# Patient Record
Sex: Male | Born: 1950
Health system: Southern US, Community
[De-identification: ages and names within clinical notes are randomized; demographics above are authoritative.]

## PROBLEM LIST (undated history)

## (undated) DIAGNOSIS — I10 Essential (primary) hypertension: Secondary | ICD-10-CM

## (undated) DIAGNOSIS — K5790 Diverticulosis of intestine, part unspecified, without perforation or abscess without bleeding: Secondary | ICD-10-CM

## (undated) DIAGNOSIS — K635 Polyp of colon: Secondary | ICD-10-CM

## (undated) DIAGNOSIS — Z8619 Personal history of other infectious and parasitic diseases: Secondary | ICD-10-CM

## (undated) DIAGNOSIS — I219 Acute myocardial infarction, unspecified: Secondary | ICD-10-CM

## (undated) DIAGNOSIS — K648 Other hemorrhoids: Secondary | ICD-10-CM

## (undated) DIAGNOSIS — B019 Varicella without complication: Secondary | ICD-10-CM

## (undated) DIAGNOSIS — E785 Hyperlipidemia, unspecified: Secondary | ICD-10-CM

## (undated) DIAGNOSIS — J439 Emphysema, unspecified: Secondary | ICD-10-CM

## (undated) DIAGNOSIS — Z87891 Personal history of nicotine dependence: Secondary | ICD-10-CM

## (undated) DIAGNOSIS — I2510 Atherosclerotic heart disease of native coronary artery without angina pectoris: Secondary | ICD-10-CM

## (undated) HISTORY — DX: Acute myocardial infarction, unspecified: I21.9

## (undated) HISTORY — DX: Varicella without complication: B01.9

## (undated) HISTORY — PX: VASECTOMY: SHX75

## (undated) HISTORY — DX: Other hemorrhoids: K64.8

## (undated) HISTORY — DX: Polyp of colon: K63.5

## (undated) HISTORY — DX: Hyperlipidemia, unspecified: E78.5

## (undated) HISTORY — DX: Emphysema, unspecified: J43.9

## (undated) HISTORY — PX: COLONOSCOPY: SHX174

## (undated) HISTORY — DX: Diverticulosis of intestine, part unspecified, without perforation or abscess without bleeding: K57.90

## (undated) HISTORY — DX: Essential (primary) hypertension: I10

## (undated) HISTORY — DX: Atherosclerotic heart disease of native coronary artery without angina pectoris: I25.10

## (undated) HISTORY — PX: WISDOM TOOTH EXTRACTION: SHX21

## (undated) HISTORY — DX: Personal history of nicotine dependence: Z87.891

## (undated) HISTORY — PX: POLYPECTOMY: SHX149

## (undated) HISTORY — PX: CORONARY STENT PLACEMENT: SHX1402

## (undated) HISTORY — DX: Personal history of other infectious and parasitic diseases: Z86.19

## (undated) HISTORY — PX: KNEE ARTHROSCOPY: SUR90

---

## 1978-09-03 HISTORY — PX: VASECTOMY: SHX75

## 1978-09-03 HISTORY — PX: WISDOM TOOTH EXTRACTION: SHX21

## 1995-09-04 HISTORY — PX: KNEE ARTHROSCOPY: SUR90

## 2003-09-04 HISTORY — PX: KNEE ARTHROSCOPY: SUR90

## 2007-09-04 HISTORY — PX: CORONARY STENT PLACEMENT: SHX1402

## 2007-12-08 ENCOUNTER — Ambulatory Visit: Payer: Self-pay | Admitting: Cardiovascular Disease

## 2007-12-08 ENCOUNTER — Inpatient Hospital Stay (HOSPITAL_COMMUNITY): Admission: EM | Admit: 2007-12-08 | Discharge: 2007-12-11 | Payer: Self-pay | Admitting: Emergency Medicine

## 2007-12-08 DIAGNOSIS — I219 Acute myocardial infarction, unspecified: Secondary | ICD-10-CM

## 2007-12-08 DIAGNOSIS — I2510 Atherosclerotic heart disease of native coronary artery without angina pectoris: Secondary | ICD-10-CM | POA: Insufficient documentation

## 2007-12-08 HISTORY — DX: Acute myocardial infarction, unspecified: I21.9

## 2007-12-09 ENCOUNTER — Encounter: Payer: Self-pay | Admitting: Cardiovascular Disease

## 2007-12-30 ENCOUNTER — Ambulatory Visit: Payer: Self-pay | Admitting: Cardiovascular Disease

## 2008-03-01 ENCOUNTER — Encounter: Payer: Self-pay | Admitting: Cardiovascular Disease

## 2008-03-01 ENCOUNTER — Ambulatory Visit: Payer: Self-pay

## 2008-03-01 ENCOUNTER — Ambulatory Visit: Payer: Self-pay | Admitting: Cardiovascular Disease

## 2008-03-01 LAB — CONVERTED CEMR LAB
Albumin: 3.7 g/dL (ref 3.5–5.2)
BUN: 15 mg/dL (ref 6–23)
Calcium: 9 mg/dL (ref 8.4–10.5)
Cholesterol: 125 mg/dL (ref 0–200)
Creatinine, Ser: 1.2 mg/dL (ref 0.4–1.5)
GFR calc Af Amer: 81 mL/min
GFR calc non Af Amer: 67 mL/min
LDL Cholesterol: 58 mg/dL (ref 0–99)
Total CHOL/HDL Ratio: 2.2
Triglycerides: 51 mg/dL (ref 0–149)
VLDL: 10 mg/dL (ref 0–40)

## 2008-08-16 ENCOUNTER — Ambulatory Visit: Payer: Self-pay | Admitting: Cardiovascular Disease

## 2008-08-16 LAB — CONVERTED CEMR LAB
ALT: 30 units/L (ref 0–53)
Albumin: 3.9 g/dL (ref 3.5–5.2)
Bilirubin, Direct: 0.1 mg/dL (ref 0.0–0.3)
Cholesterol: 128 mg/dL (ref 0–200)
HDL: 58.8 mg/dL (ref >39.0)
Total Protein: 6.7 g/dL (ref 6.0–8.3)
Triglycerides: 70 mg/dL (ref 0–149)
VLDL: 14 mg/dL (ref 0–40)

## 2008-09-07 DIAGNOSIS — E785 Hyperlipidemia, unspecified: Secondary | ICD-10-CM | POA: Insufficient documentation

## 2008-09-20 ENCOUNTER — Ambulatory Visit: Payer: Self-pay

## 2009-01-14 ENCOUNTER — Encounter: Payer: Self-pay | Admitting: Cardiovascular Disease

## 2009-01-24 ENCOUNTER — Encounter (INDEPENDENT_AMBULATORY_CARE_PROVIDER_SITE_OTHER): Payer: Self-pay | Admitting: *Deleted

## 2009-06-02 ENCOUNTER — Ambulatory Visit: Payer: Self-pay | Admitting: Cardiovascular Disease

## 2009-09-16 ENCOUNTER — Ambulatory Visit: Payer: Self-pay | Admitting: Cardiovascular Disease

## 2009-09-23 ENCOUNTER — Telehealth: Payer: Self-pay | Admitting: Cardiovascular Disease

## 2009-09-23 LAB — CONVERTED CEMR LAB
Bilirubin, Direct: 0.1 mg/dL (ref 0.0–0.3)
HDL: 63.1 mg/dL (ref >39.00)
Total CHOL/HDL Ratio: 3
Total Protein: 6.4 g/dL (ref 6.0–8.3)
Triglycerides: 65 mg/dL (ref 0.0–149.0)

## 2010-07-31 ENCOUNTER — Encounter: Payer: Self-pay | Admitting: Cardiovascular Disease

## 2010-07-31 ENCOUNTER — Ambulatory Visit: Payer: Self-pay | Admitting: Cardiovascular Disease

## 2010-10-03 NOTE — Progress Notes (Signed)
Summary: returning call  Phone Note Call from Patient Call back at [phone]   Caller: Patient Reason for Call: Talk to Nurse Summary of Call: returing call Initial call taken by: Migdalia Dk,  September 23, 2009 10:06 AM  Follow-up for Phone Call        I spoke with the pt.  Follow-up by: Sherri Rad, RN, BSN,  September 23, 2009 10:24 AM

## 2010-10-03 NOTE — Assessment & Plan Note (Signed)
Summary: per check out/sf   Visit Type:  1 year follow up Primary Lovada Barwick:  none  CC:  No complaints.  History of Present Illness: This is a 60 year old gentleman who sustained an anterior wall myocardial infarction in 2009. He was treated with primary PCI using a bare-metal stent to the LAD. He has had full recovery of LV function and had a normal Myoview stress scan in 2010.  The patient is doing well without complaints. He denies chest pain or pressure. No dyspnea, edema, orthopnea, palps, or PND.    Current Medications (verified): 1)  Simvastatin 40 Mg Tabs (Simvastatin) .... Take One Tablet By Mouth Daily At Bedtime 2)  Carvedilol 6.25 Mg Tabs (Carvedilol) .... Take One Tablet By Mouth Twice A Day 3)  Aspirin 81 Mg Tbec (Aspirin) .... Take One Tablet By Mouth Daily 4)  Nitrostat 0.4 Mg Subl (Nitroglycerin) .... As Directed For Chest Pain  Allergies (verified): No Known Drug Allergies  Past History:  Past medical history reviewed for relevance to current acute and chronic problems.  Past Medical History: Reviewed history from 09/07/2008 and no changes required. CAD s/p anterior MI 12/2007 Hyperlipidemia Hx tobacco  Review of Systems       Negative except as per HPI   Vital Signs:  Patient profile:   60 year old male Height:      72 inches Weight:      184.75 pounds BMI:     25.15 Pulse rate:   56 / minute Pulse rhythm:   regular Resp:     18 per minute BP sitting:   124 / 80  (left arm) Cuff size:   large  Vitals Entered By: Vikki Ports (July 31, 2010 9:53 AM)  Physical Exam  General:  Pt is alert and oriented, in no acute distress. HEENT: normal Neck: normal carotid upstrokes without bruits, JVP normal Lungs: CTA CV: regular, bradycardic without murmur or gallop Abd: soft, NT, positive BS, no bruit, no organomegaly Ext: no clubbing, cyanosis, or edema. peripheral pulses 2+ and equal Skin: warm and dry without rash    EKG  Procedure date:   07/31/2010  Findings:      Sinus bradycardia, otherwise within normal limits. HR 56 bpm.  Impression & Recommendations:  Problem # 1:  CAD, NATIVE VESSEL (ICD-414.01) Pt with hx of MI. Doing well without anginal symptoms and LV function has fully recovered. Recommend continue current medical management without changes. Encouraged him to initiate an exercise program as he has gained 5 pounds since last year. He continues to abstain from tobacco ever since his MI in 2009.  His updated medication list for this problem includes:    Carvedilol 6.25 Mg Tabs (Carvedilol) .Marland Kitchen... Take one tablet by mouth twice a day    Aspirin 81 Mg Tbec (Aspirin) .Marland Kitchen... Take one tablet by mouth daily    Nitrostat 0.4 Mg Subl (Nitroglycerin) .Marland Kitchen... As directed for chest pain  Orders: EKG w/ Interpretation (93000)  Problem # 2:  HYPERLIPIDEMIA-MIXED (ICD-272.4) Lipids have been at goal. Recommend followup lipids and lft's in January 2012 for one year lab followup.   He requests a PCP referral for appropriate screening eval. He has never had a PSA or colonoscopy checked. Will refer him to Dr Abner Greenspan in [facility].  His updated medication list for this problem includes:    Simvastatin 40 Mg Tabs (Simvastatin) .Marland Kitchen... Take one tablet by mouth daily at bedtime  Orders: EKG w/ Interpretation (93000)  CHOL: 166 (09/16/2009)   LDL:  90 (09/16/2009)   HDL: 63.10 (09/16/2009)   TG: 65.0 (09/16/2009)  Patient Instructions: 1)  Your physician recommends that you schedule a follow-up appointment in: 1 year with Dr. Excell Seltzer 2)  Your physician recommends that you return for a FASTING lipid profile & liver test in JANUARY 2012 3)  Your physician recommends that you continue on your current medications as directed. Please refer to the Current Medication list given to you today. 4)  You have been referred to DR. BLYTHE AT [facility] Telephone # [phone] Prescriptions: NITROSTAT 0.4 MG SUBL (NITROGLYCERIN) As directed for  chest pain  #25 x 11   Entered by:   Lisabeth Devoid RN   Authorized by:   Norva Karvonen, MD   Signed by:   Lisabeth Devoid RN on 07/31/2010   Method used:   Electronically to        [facility] (retail)       [address]       [place], [place]  [address]       Ph: [phone] or [phone]       Fax: [phone]   RxID:   [id number]

## 2010-10-17 ENCOUNTER — Encounter: Payer: Self-pay | Admitting: Cardiovascular Disease

## 2010-10-17 ENCOUNTER — Other Ambulatory Visit: Payer: Self-pay | Admitting: Cardiovascular Disease

## 2010-10-17 ENCOUNTER — Other Ambulatory Visit (INDEPENDENT_AMBULATORY_CARE_PROVIDER_SITE_OTHER): Payer: BC Managed Care – PPO

## 2010-10-17 DIAGNOSIS — E785 Hyperlipidemia, unspecified: Secondary | ICD-10-CM

## 2010-10-17 LAB — LIPID PANEL
LDL Cholesterol: 98 mg/dL (ref 0–99)
VLDL: 13.2 mg/dL (ref 0.0–40.0)

## 2010-10-17 LAB — HEPATIC FUNCTION PANEL
ALT: 38 U/L (ref 0–53)
Bilirubin, Direct: 0.1 mg/dL (ref 0.0–0.3)
Total Bilirubin: 0.6 mg/dL (ref 0.3–1.2)

## 2011-01-16 NOTE — Cardiovascular Report (Signed)
NAMEOLAN, KUREK NO.:  [id number]   MEDICAL RECORD NO.:  [id number]          PATIENT TYPE:  INP   LOCATION:  2313                         FACILITY:  [facility]   PHYSICIAN:  Veverly Fells. Excell Seltzer, MD  DATE OF BIRTH:  1951-08-18   DATE OF PROCEDURE:  12/08/2007  DATE OF DISCHARGE:                            CARDIAC CATHETERIZATION   PROCEDURE:  Left heart catheterization, selective coronary angiography,  left ventricular angiography, aspiration thrombectomy, stenting and  intravascular ultrasound of the LAD.   INDICATIONS:  Mr. Ingwersen is a 60 year old gentleman who presented  with intermittent chest pain over two days.  He had ongoing pain at  presentation and was found to have anterolateral ST elevation consistent  with an acute anterior MI.  He was brought emergently to the cardiac  catheterization lab.   Risks and indications of the procedure were reviewed with the patient.  Informed consent was obtained.  The right groin was prepped, draped, and  anesthetized with 1% lidocaine using modified Seldinger technique.  A 6-  French sheath was placed in the right femoral artery via a front wall  puncture.  Standard 6-French Judkins catheters were used for coronary  angiography.  Following initial angiography, a 6-French XB LAD 3.5-cm  guide catheter was inserted.  The LAD was totally occluded, and the  occlusion appeared acute.  The patient had been preloaded with 600 mg of  Plavix.  Angiomax was started for anticoagulation with a bolus and drip.  Once a therapeutic ACT was achieved, a Cougar guidewire was passed  easily beyond the area of total occlusion.  Flow was restored with the  wire crossing the vessel.  I elected to perform aspiration thrombectomy  because there was very large filling defect suggestive of large thrombus  burden.  A fetch catheter was inserted, and two separate fetch runs were  performed.  There was mild improvement in the angiographic  appearance  after aspiration and TIMI-3 flow was present at that point.  The LAD was  a very large vessel.  I elected to place a 3.5 x 16-mm Liberte stent  which was deployed at 16 atmospheres, taking the stent to essentially 4  mm.  Following stenting, there appeared to be residual thrombus, most  notable at the distal edge of the stent.  I elected to post dilate with  a 4.5 x 12-mm Quantum Maverick balloon which was taken to 14 atmospheres  on a single inflation.  There was good flow throughout with mildly  sluggish flow, TIMI-2, in the apical portion of the LAD.  There was some  residual filling defect within the stent, and I elected to perform  intravascular ultrasound at that point to determine whether a second  stent was needed at the distal edge.  Intracoronary nitroglycerin 100  mcg was given.  The IVUS probe was inserted beyond the stent into the  mid-LAD, and an automated pullback was performed.  The IVUS demonstrated  good stent apposition distally with mild nonocclusive thrombus beyond  the area of stent as well as into the stent.  Proximally, there was a  heavy thrombus burden  outside the stent edge, and that portion of the  stent was not well opposed to the vessel.  However, the vessel was  extremely large in this area.  The stent did appear opposed to the  thrombus burden.  I had difficulty removing the IVUS as it would not  pull back into the guide catheter.  Ultimately, I had to pull the entire  guide and IVUS probe out of the body, and somehow, there was a loop  created in the in the wire.  At that point, I advanced a 6-French  diagnostic JL-4 catheter back into the patient and performed final  angiography.  This demonstrated TIMI-3 flow throughout.  There was mild  residual thrombus present that appeared unchanged.  Integrilin had been  started when I first saw with thrombus and was given with a double bolus  drip.  The drip was continued, and I elected to continue  with  pharmacotherapy rather than further manipulating the thrombus burden in  the mid-LAD.  At that point, the JL-4 catheter was changed out for an  angled pigtail catheter, and left ventricular pressure was recorded.  A  left ventriculogram was performed, and a pullback across the aortic  valve was done.   At the completion of the procedure, I attempted to pass an Angio-Seal  device and to close the femoral arteriotomy.  However, there was a great  deal of resistance at the junction of the dilator and the delivery  sheath for the Angio-Seal.  The patient had pain with advancing this  over the wire, and I elected to abort the Angio-Seal and placed a 6-  French sheath back into the vessel.  This was done without difficulty.  The patient developed a local hematoma, and I decided to upsize the  sheath to a 7-French.  After approximately 10 minutes of manual  pressure, the groin appeared stable with no further hematoma.  The  patient tolerated the procedure well and was chest pain free at the  conclusion.   FINDINGS:  Aortic pressure 111/69 with mean of 88, left ventricular  pressure 110/19.   Left mainstem:  The left mainstem is free of any significant  angiographic stenosis.  It is a large left main that bifurcates into the  LAD and left circumflex.   LAD:  The LAD is a large-caliber vessel that is totally occluded in its  proximal portion.  There is minimal flow beyond the area of occlusion,  and the flow was TIMI 0.  There is a large filling defect suggestive of  a large thrombus burden.  Post stenting the LAD is a large vessel that  supplies a moderate first diagonal and a large second diagonal branch.  The LAD wraps around left ventricular apex, and there is no significant  stenosis throughout the remaining portions of the vessel.   Left circumflex:  The left circumflex is a tortuous vessel.  There is a  small intermediate branch and a small first OM.  There is a large second   OM branch.  The left circumflex in the midportion has a 50% stenosis  that appears nonobstructive.   Right coronary artery:  The right coronary artery is dominant.  It is a  diffusely diseased vessel that supplies a PDA and one posterolateral  branch.  There is a 50% proximal stenosis and a 50% mid stenosis just  before the acute marginal branch.  There is no severe stenosis located  in the vessel.  There is TIMI-3  flow.  There is minimal right-to-left  collaterals supplying the LAD territory.   Left ventriculography demonstrates mild hypokinesis of the anterolateral  wall and apex.  Overall LV function appears only mildly reduced with an  LVEF estimated at 50-55%.  The LV apex is poorly visualized by  ventriculography.   ASSESSMENT:  1. Total occlusion of the LAD with successful stenting using a bare-      metal stent.  2. Nonobstructive circumflex and right coronary artery stenosis.  3. Mild left ventricular systolic dysfunction.   DISCUSSION:  Mr. Gail will require routine post MI care with  careful observation in the intensive care unit.  Will continue  Integrilin for 18 hours as long as the patient's groin site remains  stable.  Will continue aspirin and Plavix.  With residual thrombus  burden in the vessel, may consider relook coronary angiography in 48-72  hours.  Will initiate medical therapy with high-dose statin and low-dose  metoprolol.      Veverly Fells. Excell Seltzer, MD  Electronically Signed     MDC/MEDQ  D:  12/08/2007  T:  12/09/2007  Job:  [id number]

## 2011-01-16 NOTE — H&P (Signed)
[name] [name], [name] [name] NO.:  [id number]   MEDICAL RECORD NO.:  [id number]          PATIENT TYPE:  EMS   LOCATION:  MAJO                         FACILITY:  [facility]   PHYSICIAN:  Brayton El, MD    DATE OF BIRTH:  [birth year]   DATE OF ADMISSION:  12/08/2007  DATE OF DISCHARGE:                              HISTORY & PHYSICAL   CHIEF COMPLAINT:  Chest pain.   HISTORY OF PRESENT ILLNESS:  The patient is a 60 year old white male  with past medical history significant for ongoing tobacco use presenting  with 3-day history of intermittent chest discomfort.  The patient states  that for the past 3 days he has had chest discomfort in the center of  his chest without radiation or associated symptoms other than belching.  The patient states that the episodes of pain last from 15-20 minutes and  have been relieved with Rolaids.  Today, he did not experience any pain  until this evening while he was in a play at church and he began to  experience from 10/10 substernal chest pain.  EMS was called and he was  brought here to [facility].  12-lead EKG in route showed 2 mm  ST segment elevation in the anterior septal leads with Q waves in V1 and  V2.  Upon arrival, the patient is still having 3/10 chest pain.  In the  ED, the patient was given 600 mg of Plavix, aspirin 325 mg (which was  given by EMS), and nitroglycerin.  He is transferred to the cardiac  catheterization lab for urgent left heart catheterization and possible  PCI.  The patient states that other than this intermittent chest pain he  has been having over the past few days, he has had no other complaints.   PAST MEDICAL HISTORY:  None.   SOCIAL HISTORY:  Positive for ongoing tobacco use.  No drug use.   FAMILY HISTORY:  Negative for premature coronary disease.   ALLERGIES:  No known drug allergies.   MEDICATIONS:  None.   REVIEW OF SYSTEMS:  The patient specifically denies any hematochezia,  melena,  nausea, vomiting, emesis.  Otherwise as in HPI.  Otherwise all  other systems are negative.   PHYSICAL EXAMINATION:  VITAL SIGNS:  Blood pressure 116/80, pulse 82,  saturating greater than 96% on 2 liters nasal cannula oxygen.  GENERAL:  He is in no acute distress.  HEENT:  Nonfocal.  Oropharynx is clear.  NECK:  Supple.  HEART:  Regular rate and rhythm without murmurs, rubs, or gallops.  LUNGS:  Clear bilaterally.  ABDOMEN:  Soft, nontender, and nondistended.  EXTREMITIES:  Without edema.  He has 2+ femoral pulses bilaterally.  PSYCHIATRIC:  The patient is appropriate with normal levels of insight.  NEUROLOGY:  Nonfocal.  SKIN:  Warm and dry with no evidence of a lesion.   EKG; normal sinus rhythm.  There are Q waves in V1, V2, and  approximately 2 mm ST segment elevation in V1 through V4.   ASSESSMENT:  The patient appears to be having an evolving ST segment  elevation myocardial  infarction.  As he is currently still having chest  pain, we will take him emergently to the cardiac catheterization lab for  a left heart catheterization and possible percutaneous coronary  intervention.  The risks and benefits of the procedure were explained to  him and his wife and they agree to undergo that procedure.      Brayton El, MD  Electronically Signed     SGA/MEDQ  D:  12/08/2007  T:  12/08/2007  Job:  [id number]

## 2011-01-16 NOTE — Discharge Summary (Signed)
NAMEMONICA, ZAHLER NO.:  [id number]   MEDICAL RECORD NO.:  [id number]          PATIENT TYPE:  INP   LOCATION:  3702                         FACILITY:  [facility]   PHYSICIAN:  Theodore Demark, PA-C   DATE OF BIRTH:  [birth year]   DATE OF ADMISSION:  12/08/2007  DATE OF DISCHARGE:  12/11/2007                               DISCHARGE SUMMARY   PROCEDURES:  1. Cardiac catheterization.  2. Coronary arteriogram.  3. Left ventriculogram.  4. 2D echocardiogram.   PRIMARY AND FINAL DISCHARGE DIAGNOSIS:  Anterior ST elevation,  myocardial infarction.   SECONDARY DIAGNOSES:  1. Ischemic cardiomyopathy.  2. Chronic hypotension, asymptomatic.  3. Hyperlipidemia with a total cholesterol of 166, triglycerides 100,      HDL 46, LDL 100, goal is 70.   TIME OF DISCHARGE:  40 minutes.   HOSPITAL COURSE:  Mr. [name] [name] is a 60 year old male with no previous  history of coronary artery disease.  He began experiencing substernal  chest pain on the day of admission, which did not resolve.  EMS was  called and his 12-lead EKG showed anterior ST elevation and he was taken  urgently to the cath lab.   A Cardiac catheterization showed a totaled LAD treated with PTCA and a  3.5 x 16-mm bare-metal Liberte stent.  He had TIMI III flow  postprocedure.  Initially, he had anteroapical hypokinesis, but the apex  was poorly visualized and his EF appeared to be 55%; however, an  echocardiogram was performed to follow up on this and his EF was  estimated 35% with overall left ventricular systolic function moderately  decreased and septal and apical akinesis as well as anterior wall  hypokinesis.  He was started on a beta-blocker, but his blood pressure  kept dropping into the 80s systolically.  He said the dose was  decreased.  He is tolerating a low dose of beta-blocker, but we were  unable to add an ACE inhibitor because of blood pressure issues.  He was  seen by cardiac rehab and smoking  cessation during his hospital stay.  He will be referred to outpatient cardiac rehab as well.   By December 11, 2007, Mr. Matherly's condition had significantly improved.  He was started on Wellbutrin and a nicotine patch for smoking cessation  and was counseled on other aspects of heart-healthy lifestyle.  He was  ambulating without chest pain or shortness of breath and considered  stable for discharge with close outpatient followup.   DISCHARGE INSTRUCTIONS:  1. His activity level is to be increased gradually with no lifting for      2 weeks and no driving for 3 days.  2. He is to stick to a low-sodium heart-healthy diet.  3. He is to call our office for problems with the cath site.  He is      not to use tobacco.  He is to follow up with Dr. Excell Seltzer on December 30, 2007, at 3:00 p.m. with Dr. Clarita Leber as needed.   DISCHARGE MEDICATIONS:  1. Aspirin 325 mg daily.  2. Plavix 75 mg daily.  3. Nitroglycerin sublingual p.r.n.  4. Nicotine patch 14 mg, stair step down to 7 and then stop.  5. Wellbutrin SR 150 mg b.i.d.  6. Lipitor 80 mg a day.  7. Coreg 3.125 mg b.i.d..   Mr. Dimaria did not get a chest x-ray during his hospital stay, but he  is afebrile and had clear lungs by exam.  This can be obtained as an  outpatient.      Theodore Demark, PA-C     RB/MEDQ  D:  12/11/2007  T:  12/12/2007  Job:  [id number]

## 2011-01-16 NOTE — Assessment & Plan Note (Signed)
[facility]                            CARDIOLOGY OFFICE NOTE   NAME:Fuerst, LUAN                      MRN:          [id number]  DATE:03/01/2008                            DOB:          Jan 08, 1951    Marsh Dolly returns for followup with the [facility] Office  on March 01, 2008.  He is a 60 year old gentleman who sustained an  anterior MI on December 08, 2007.  He underwent emergency cardiac  catheterization and was treated with primary PCI.  A bare-metal stent  was placed in the LAD.  He had an uneventful post MI course and was  discharged a few days later.  His LVEF was 35% at the time of his  infarction.   Mr. Mondry continues to do well.  He has fully recovered from his  myocardial infarction and he is back to work.  He is very active at work  and does regular walking and lifting.  He has not been engaged in any  formal exercise.  He is having no symptoms at this time.  He  specifically denies chest pain, dyspnea, lightheadedness, palpitations  orthopnea, PND or leg edema.  He has successfully stopped smoking.  He  would like to come off Wellbutrin, which was prescribed for tobacco  cessation.   MEDICATIONS:  1. Aspirin 3-25 mg daily.  2. Plavix 75 mg daily.  3. Wellbutrin SR 150 mg twice daily.  4. Lipitor 80 mg daily.  5. Coreg 6.25 mg twice daily.   ALLERGIES:  NKDA.   PHYSICAL EXAMINATION:  GENERAL:  The patient is alert and oriented.  He  is in no acute distress.  VITALS:  Weight is 160 pounds, blood pressure 112/80, heart rate is 65,  and respiratory rate 16.  HEENT:  Normal.  NECK:  Normal carotid upstrokes without bruits.  Jugulovenous pressure  is normal.  LUNGS:  Clear to auscultation bilaterally.  HEART:  Regular rate and rhythm without murmurs or gallops.  ABDOMEN:  Soft, nontender no organomegaly.  EXTREMITIES:  No clubbing, cyanosis or edema.  Peripheral pulses 2+ and  equal throughout.   ASSESSMENT:  1.  Status post anterior myocardial infarction and treatment with bare-      metal stent in the left anterior descending.  2. Under assessment for dyslipidemia.  3. History of tobacco abuse with successful smoking cessation.  4. Ischemic cardiomyopathy with left ventricular ejection fraction 35%      by initial echocardiogram.   PLAN:  A 2-D echo was checked in the office today to reassess left  ventricular function now over 2 months out from his event.  I have  reviewed his echo images, but the official interpretation is pending.  It looks like his left ventricular function has completely recovered,  and I do not appreciate any hypokinesis of the anterior wall are apex.  His left ventricular ejection fraction by my estimate is approximately  60%.  I am very pleased with his recovery and I have recommended  continuing his current medical therapy with Coreg, Lipitor, aspirin, and  Plavix.  I have asked him to  reduce his aspirin dose to 81 mg daily.  He  can safely wean off Wellbutrin at this point.  I have asked the drop his  dose to 150 mg once daily for 1 week and then discontinue all together.  I like to see Mr. Whittley back in 6 months for followup.  Of note, a  chest x-ray was done in the office today since it was not performed  during his hospitalization.  He has hyperexpanded but clear lung fields  and his heart size appears normal.  Official interpretation by Radiology  is currently pending.  A lipid panel and liver function tests were also  checked today which are currently pending.  I spoke with Mr. Revolorio  about enrollment in the PRA study and he would like to think this over.  We will review at his next office visit.      Veverly Fells. Excell Seltzer, MD  Electronically Signed     Veverly Fells. Excell Seltzer, MD  Electronically Signed   MDC/MedQ  DD: 03/01/2008  DT: 03/02/2008  Job #: [id number]

## 2011-01-16 NOTE — Assessment & Plan Note (Signed)
[facility]                            CARDIOLOGY OFFICE NOTE   NAME:Surgeon, DOMONIC                      MRN:          [id number]  DATE:12/30/2007                            DOB:          Jun 13, 1951    [name] [name] is seen in followup at the [facility] Cardiology office on  December 30, 2007.  [name] [name] is a 60 year old gentleman who was  hospitalized from April 6 through December 11, 2007.  He presented with an  acute anterior wall myocardial infarction.  He was found to have an  occluded LAD that was treated with a single bare metal stent.  He had an  uneventful post MI hospital course.  Of note, his LVEF by echocardiogram  was 35% with akinesis of the anteroseptum and apex.  At the time of  discharge he was able to tolerate a low dose of Coreg but could not  tolerate an ACE inhibitor because of low blood pressure.   Since return home [name] [name] is doing well.  He specifically denies  chest pain, dyspnea, orthopnea, PND or edema.  He has been restricting  his activity but with low level activities he has no symptoms.  He also  denies lightheadedness, dizziness or near syncope.   MEDICATIONS:  1. Include aspirin 325 mg daily.  2. Plavix 75 mg daily.  3. Nicotine patch.  4. Wellbutrin SR 150 mg twice daily.  5. Lipitor 80 mg daily.  6. Coreg 3.125 mg twice daily.   ALLERGIES:  NKDA.   PHYSICAL EXAMINATION:  GENERAL:  On exam the patient is alert and  oriented.  He is in no acute distress.  VITAL SIGNS:  Weight 159, blood pressure is 120/80, heart rate 60,  respiratory rate 16.  HEENT:  Normal.  NECK:  Normal carotid upstrokes without bruits.  Venous jugular pressure  is normal.  LUNGS:  Clear to auscultation bilaterally.  HEART:  Regular rate and rhythm without murmurs or gallops.  ABDOMEN:  Soft, nontender with no organomegaly.  EXTREMITIES:  No clubbing, cyanosis or edema.  Peripheral pulses 2+ and  equal throughout.  SKIN:  Warm and  dry without rash.   EKG shows sinus rhythm at 59 beats per minute.  There is a marked  anteroseptal T-wave abnormality consistent with his recent myocardial  infarction with biphasic T-waves.   DATA:  Reviewed from his hospitalization:  1. Cardiac catheterization - proximal LAD occlusion, 50% mid left      circumflex stenosis and 50% diffuse stenosis of the right coronary      artery.  2. Echocardiogram - moderately reduced LV systolic function with an      LVEF of 35% with septal, apical and anterior wall hypokinesis.  3. Lipids - total cholesterol 166, triglycerides 100, HDL 46, LDL 100.   ASSESSMENT:  1. Status post anterior wall myocardial infarction.  [name] [name] is      tolerating medical therapy.  I am pleased with his clinical      progress.  He will continue aspirin 325 mg and Plavix 75 mg daily.      We  will reduce his aspirin dose to 81 mg when he is 3 months out      from his infarct.  We will repeat an echocardiogram at the time of      his return visit in 2 months to assess for LV recovery.  I would      like to increase his Coreg to 6.25 mg twice daily.  We will plan on      initiating an ACE inhibitor when he returns as well but since he      has had difficulties with low blood pressure I am going to only      make the one medication change today with an increase in his Coreg.  2. Dyslipidemia.  Continue Lipitor 80 mg, follow up cholesterol panel      at time of return visit.  3. Return to work.  I filled out paperwork today and have estimated      that he could return to work June 1.  4. Followup.  I will see [name] [name] back when he returns for his      echocardiogram and followup lipid panel.     Veverly Fells. Excell Seltzer, MD  Electronically Signed    MDC/MedQ  DD: 01/08/2008  DT: 01/08/2008  Job #: [id number]   cc:   Katrina Stack, NP

## 2011-01-16 NOTE — Assessment & Plan Note (Signed)
[facility] [facility]                            CARDIOLOGY OFFICE NOTE   NAME:[name], [name]                      MRN:          [id number]  DATE:08/16/2008                            DOB:          Sep 11, 1950    REASON FOR VISIT:  Followup CAD and prior MI.   HISTORY OF PRESENT ILLNESS:  [name] [name] is a 60 year old gentleman  with coronary artery disease, who sustained an anterior wall MI in April  of this year.  He underwent emergency PCI and was treated with a bare-  metal stent in the mid LAD.  He had an uneventful post MI recovery and  was discharged from the hospital a few days later.  His LVEF improved  from an initial value of 35% up to 55% with on followup echo in June.   From a symptomatic standpoint, he continues to do well.  He has not  engaged in regular exercise, but he does hard physical work.  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  He  has no symptoms of work.  He specifically denies chest pain, dyspnea,  orthopnea, PND, edema, or palpitations.  He experiences occasional  lightheadedness within the morning after his medications.  He has had no  presyncope.   MEDICATIONS:  1. Plavix 75 mg daily.  2. Aspirin 81 mg daily.  3. Coreg 6.25 mg twice daily.  4. Lipitor 80 mg daily.   ALLERGIES:  NKDA.   PHYSICAL EXAMINATION:  GENERAL:  The patient is alert and oriented.  He  is in no acute distress.  VITAL SIGNS:  Weight is 173 pounds, blood pressure 120/70, heart rate  52, and respiratory rate 12.  HEENT:  Normal.  NECK:  Normal carotid upstrokes.  No bruits.  JVP normal.  LUNGS:  Clear bilaterally.  HEART:  Bradycardic and regular without murmurs or gallops.  ABDOMEN:  Soft and nontender.  No organomegaly.  EXTREMITIES:  No clubbing, cyanosis, or edema.  SKIN:  Warm and dry without rash.   EKG shows sinus bradycardia and is otherwise within normal limits.   ASSESSMENT:  1. Coronary artery  disease, status post anterior wall myocardial      infarction.  [name] [name] underwent bare-metal stenting over 6      months ago.  He also had mild-to-moderate disease in the right      coronary artery and left circumflex with 50% stenosis in those      vessels.  I like him to undergo an exercise Myoview stress scan to      evaluate for further ischemia.  Otherwise, we will continue his      current medications without changes.  I would like to see him back      in 6 months for followup.  He is having no angina at this time.  2. Hyperlipidemia.  He has done well on high dose of atorvastatin.      Lipids from June showed cholesterol 125, triglycerides 51, HDL 57,      and LDL 58.  His LFTs were  within normal limits.  We will follow up      lipids and LFTs today.  If they remain in good range, we will begin      yearly follow up thereafter.  3. Abnormal chest x-ray.  A chest x-ray from June 29, showed chronic      obstructive pulmonary disease with probable scar in the right mid      upper lung field.  There was no previous for comparison.      Therefore, we will repeat a chest x-ray today for followup.   For followup, I would like to see [name] [name] back in 6 months.  No  changes were made to his medical regimen.     Veverly Fells. Excell Seltzer, MD  Electronically Signed    MDC/MedQ  DD: 08/16/2008  DT: 08/17/2008  Job #: [id number]

## 2011-04-02 ENCOUNTER — Other Ambulatory Visit: Payer: Self-pay | Admitting: Cardiovascular Disease

## 2011-05-29 LAB — BASIC METABOLIC PANEL
BUN: 10
BUN: 7
CO2: 24
Calcium: 8.8
Chloride: 105
Creatinine, Ser: 0.76
GFR calc non Af Amer: >60
Glucose, Bld: 90
Potassium: 4.6

## 2011-05-29 LAB — APTT: aPTT: 67 — ABNORMAL HIGH

## 2011-05-29 LAB — LIPID PANEL
Cholesterol: 167
HDL: 46
LDL Cholesterol: 100 — ABNORMAL HIGH
LDL Cholesterol: 102 — ABNORMAL HIGH
Total CHOL/HDL Ratio: 3.4
Triglycerides: 100
Triglycerides: 78
VLDL: 16

## 2011-05-29 LAB — CBC
HCT: 43.7
MCHC: 35
MCV: 96.8
MCV: 97
Platelets: 145 — ABNORMAL LOW
Platelets: 152
Platelets: 155
RBC: 4.18 — ABNORMAL LOW
RDW: 13.6
RDW: 13.6
WBC: 9.7

## 2011-05-29 LAB — DIFFERENTIAL
Eosinophils Absolute: 0.1
Lymphs Abs: 0.9
Monocytes Relative: 7
Neutro Abs: 8 — ABNORMAL HIGH
Neutrophils Relative %: 83 — ABNORMAL HIGH

## 2011-05-29 LAB — CK TOTAL AND CKMB (NOT AT ARMC): Total CK: 665 — ABNORMAL HIGH

## 2011-05-29 LAB — TROPONIN I: Troponin I: 18.79

## 2011-05-29 LAB — HEPATIC FUNCTION PANEL
Alkaline Phosphatase: 57
Bilirubin, Direct: <0.1
Total Bilirubin: 0.6

## 2011-05-29 LAB — TSH: TSH: 3.207

## 2011-07-11 ENCOUNTER — Encounter: Payer: Self-pay | Admitting: *Deleted

## 2011-07-12 ENCOUNTER — Ambulatory Visit (INDEPENDENT_AMBULATORY_CARE_PROVIDER_SITE_OTHER): Payer: BC Managed Care – PPO | Admitting: Cardiovascular Disease

## 2011-07-12 ENCOUNTER — Encounter: Payer: Self-pay | Admitting: Cardiovascular Disease

## 2011-07-12 DIAGNOSIS — I2510 Atherosclerotic heart disease of native coronary artery without angina pectoris: Secondary | ICD-10-CM

## 2011-07-12 DIAGNOSIS — E785 Hyperlipidemia, unspecified: Secondary | ICD-10-CM

## 2011-07-12 NOTE — Assessment & Plan Note (Signed)
The patient is stable without angina. He is tolerating his medical program well. He is engaged in regular exercise. He will followup in 12 months.

## 2011-07-12 NOTE — Assessment & Plan Note (Signed)
Labs are reviewed from February 2012. His lipids have been a goal and LFTs are normal. He will continue on simvastatin 40 mg and have followup blood work in February 2013.

## 2011-07-12 NOTE — Patient Instructions (Signed)
Your physician recommends that you continue on your current medications as directed. Please refer to the Current Medication list given to you today.  Your physician wants you to follow-up in: 1 YEAR. You will receive a reminder letter in the mail two months in advance. If you don't receive a letter, please call our office to schedule the follow-up appointment.  Your physician recommends that you return for a FASTING lipid, liver, BMP, CBC and TSH in February 2013---nothing to eat or drink after midnight, lab opens at 8:30

## 2011-07-12 NOTE — Progress Notes (Signed)
HPI:  The This is a 60 year old gentleman presented for followup evaluation. He has a history of anterior wall MI in 2009. His LAD was stented with a bare-metal stent. He had full recovery of LV function. He has done well with no recurrent ischemic event since his initial presentation.  He has been doing well from a symptomatic standpoint.  He rides his bike to work which is about 5 miles each way. He has no exertional symptoms. He specifically denies chest pain or pressure. He denies dyspnea, edema, or palpitations. He reports compliance with his medicines.  Outpatient Encounter Prescriptions as of 07/12/2011  Medication Sig Dispense Refill  . aspirin 81 MG tablet Take 81 mg by mouth daily.        . carvedilol (COREG) 6.25 MG tablet TAKE 1 TABLET BY MOUTH TWICE A DAY  180 tablet  2  . nitroGLYCERIN (NITROSTAT) 0.4 MG SL tablet Place 0.4 mg under the tongue every 5 (five) minutes as needed.        . simvastatin (ZOCOR) 40 MG tablet Take 40 mg by mouth at bedtime.          No Known Allergies  Past Medical History  Diagnosis Date  . Coronary artery disease   . Hyperlipidemia   . History of tobacco use     ROS: Negative except as per HPI  BP 118/80  Pulse 58  Resp 18  Ht 6' (1.829 m)  Wt 84.55 kg (186 lb 6.4 oz)  BMI 25.28 kg/m2  PHYSICAL EXAM: Pt is alert and oriented, NAD HEENT: normal Neck: JVP - normal, carotids 2+= without bruits Lungs: CTA bilaterally CV: RRR without murmur or gallop Abd: soft, NT, Positive BS, no hepatomegaly Ext: no C/C/E, distal pulses intact and equal Skin: warm/dry no rash  EKG:  Sinus bradycardia 58 beats per minute, otherwise within normal limits.  ASSESSMENT AND PLAN:

## 2011-08-30 ENCOUNTER — Other Ambulatory Visit: Payer: Self-pay | Admitting: Cardiovascular Disease

## 2011-10-09 ENCOUNTER — Other Ambulatory Visit (INDEPENDENT_AMBULATORY_CARE_PROVIDER_SITE_OTHER): Payer: BC Managed Care – PPO | Admitting: *Deleted

## 2011-10-09 DIAGNOSIS — E785 Hyperlipidemia, unspecified: Secondary | ICD-10-CM

## 2011-10-09 DIAGNOSIS — I2510 Atherosclerotic heart disease of native coronary artery without angina pectoris: Secondary | ICD-10-CM

## 2011-10-09 LAB — BASIC METABOLIC PANEL
Calcium: 8.8 mg/dL (ref 8.4–10.5)
GFR: 87.94 mL/min (ref >60.00)
Glucose, Bld: 92 mg/dL (ref 70–99)
Sodium: 139 mEq/L (ref 135–145)

## 2011-10-09 LAB — LIPID PANEL
LDL Cholesterol: 82 mg/dL (ref 0–99)
VLDL: 8.6 mg/dL (ref 0.0–40.0)

## 2011-10-09 LAB — CBC WITH DIFFERENTIAL/PLATELET
Basophils Absolute: 0.1 10*3/uL (ref 0.0–0.1)
Hemoglobin: 15.2 g/dL (ref 13.0–17.0)
Lymphocytes Relative: 17.2 % (ref 12.0–46.0)
Monocytes Relative: 7.6 % (ref 3.0–12.0)
Neutrophils Relative %: 73.3 % (ref 43.0–77.0)
Platelets: 171 10*3/uL (ref 150.0–400.0)
RDW: 13.6 % (ref 11.5–14.6)

## 2011-10-09 LAB — HEPATIC FUNCTION PANEL
AST: 24 U/L (ref 0–37)
Alkaline Phosphatase: 65 U/L (ref 39–117)
Bilirubin, Direct: 0 mg/dL (ref 0.0–0.3)
Total Bilirubin: 0.6 mg/dL (ref 0.3–1.2)

## 2012-01-15 ENCOUNTER — Other Ambulatory Visit: Payer: Self-pay | Admitting: Cardiovascular Disease

## 2012-07-22 ENCOUNTER — Encounter: Payer: Self-pay | Admitting: Cardiovascular Disease

## 2012-07-22 ENCOUNTER — Ambulatory Visit (INDEPENDENT_AMBULATORY_CARE_PROVIDER_SITE_OTHER): Payer: BC Managed Care – PPO | Admitting: Cardiovascular Disease

## 2012-07-22 VITALS — BP 120/78 | HR 60 | Ht 72.0 in | Wt 184.0 lb

## 2012-07-22 DIAGNOSIS — E785 Hyperlipidemia, unspecified: Secondary | ICD-10-CM

## 2012-07-22 DIAGNOSIS — I25810 Atherosclerosis of coronary artery bypass graft(s) without angina pectoris: Secondary | ICD-10-CM

## 2012-07-22 NOTE — Patient Instructions (Addendum)
Your physician recommends that you return for fasting lab work in: March 2014 - [facility] Lip[facility].  Your physician wants you to follow-up in: 12 months with Dr. Excell Seltzer. You will receive a reminder letter in the mail two months in advance. If you don't receive a letter, please call our office to schedule the follow-up appointment.

## 2012-07-27 ENCOUNTER — Encounter: Payer: Self-pay | Admitting: Cardiovascular Disease

## 2012-07-31 NOTE — Progress Notes (Signed)
   HPI:  61 year old gentleman presented for followup evaluation. The patient has coronary artery disease and he presented with an anterior wall MI in 2009. He was treated with a bare-metal stent in the LAD and had normalization of LV function after his myocardial infarction. He continues to do well from a symptomatic standpoint. He denies chest pain, chest pressure, dyspnea, edema, or lightheadedness.  Outpatient Encounter Prescriptions as of 07/22/2012  Medication Sig Dispense Refill  . aspirin 81 MG tablet Take 81 mg by mouth daily.        . carvedilol (COREG) 6.25 MG tablet TAKE 1 TABLET BY MOUTH TWICE A DAY  180 tablet  2  . nitroGLYCERIN (NITROSTAT) 0.4 MG SL tablet Place 0.4 mg under the tongue every 5 (five) minutes as needed.        . simvastatin (ZOCOR) 40 MG tablet TAKE ONE TABLET BY MOUTH DAILY AT BEDTIME  90 tablet  5    No Known Allergies  Past Medical History  Diagnosis Date  . Coronary artery disease   . Hyperlipidemia   . History of tobacco use     ROS: Negative except as per HPI  BP 120/78  Pulse 60  Ht 6' (1.829 m)  Wt 83.462 kg (184 lb)  BMI 24.95 kg/m2  PHYSICAL EXAM: Pt is alert and oriented, NAD HEENT: normal Neck: JVP - normal, carotids 2+= without bruits Lungs: CTA bilaterally CV: RRR without murmur or gallop Abd: soft, NT, Positive BS, no hepatomegaly Ext: no C/C/E, distal pulses intact and equal Skin: warm/dry no rash  EKG:  Normal sinus rhythm 60 beats per minute, within normal limits.  ASSESSMENT AND PLAN:  1. Coronary artery disease, native vessel. The patient is stable without anginal symptoms. He's had no recurrent events since his initial myocardial infarction. He'll remain on aspirin, carvedilol, and simvastatin. I'll see him back in one year for followup.  2. Hyperlipidemia. Lipids were reviewed from February with a total cholesterol 145, HDL 55, and LDL 82. His liver function tests were within normal limits.  Tonny Bollman

## 2012-09-19 ENCOUNTER — Other Ambulatory Visit: Payer: Self-pay | Admitting: Cardiovascular Disease

## 2012-09-22 ENCOUNTER — Other Ambulatory Visit: Payer: Self-pay | Admitting: *Deleted

## 2012-11-03 ENCOUNTER — Other Ambulatory Visit (INDEPENDENT_AMBULATORY_CARE_PROVIDER_SITE_OTHER): Payer: BC Managed Care – PPO

## 2012-11-03 DIAGNOSIS — I25810 Atherosclerosis of coronary artery bypass graft(s) without angina pectoris: Secondary | ICD-10-CM

## 2012-11-03 DIAGNOSIS — E785 Hyperlipidemia, unspecified: Secondary | ICD-10-CM

## 2012-11-03 LAB — COMPREHENSIVE METABOLIC PANEL
AST: 19 U/L (ref 0–37)
Albumin: 3.5 g/dL (ref 3.5–5.2)
Alkaline Phosphatase: 58 U/L (ref 39–117)
Potassium: 4.3 mEq/L (ref 3.5–5.1)
Sodium: 140 mEq/L (ref 135–145)
Total Protein: 6.3 g/dL (ref 6.0–8.3)

## 2012-11-03 LAB — LIPID PANEL
LDL Cholesterol: 69 mg/dL (ref 0–99)
Total CHOL/HDL Ratio: 3
Triglycerides: 74 mg/dL (ref 0.0–149.0)
VLDL: 14.8 mg/dL (ref 0.0–40.0)

## 2012-12-10 ENCOUNTER — Telehealth: Payer: Self-pay | Admitting: Cardiovascular Disease

## 2012-12-10 NOTE — Telephone Encounter (Signed)
Pt notified of lab results

## 2012-12-10 NOTE — Telephone Encounter (Signed)
Follow Up    Calling to follow up on blood work results. Would like to speak to nurse.

## 2013-02-11 ENCOUNTER — Other Ambulatory Visit: Payer: Self-pay | Admitting: Cardiovascular Disease

## 2013-05-06 ENCOUNTER — Ambulatory Visit (INDEPENDENT_AMBULATORY_CARE_PROVIDER_SITE_OTHER): Payer: BC Managed Care – PPO | Admitting: Nurse Practitioner

## 2013-05-06 ENCOUNTER — Encounter: Payer: Self-pay | Admitting: Nurse Practitioner

## 2013-05-06 ENCOUNTER — Other Ambulatory Visit: Payer: Self-pay | Admitting: *Deleted

## 2013-05-06 VITALS — BP 120/84 | HR 64 | Temp 97.4°F | Ht 72.0 in | Wt 177.8 lb

## 2013-05-06 DIAGNOSIS — B029 Zoster without complications: Secondary | ICD-10-CM

## 2013-05-06 DIAGNOSIS — Z1211 Encounter for screening for malignant neoplasm of colon: Secondary | ICD-10-CM

## 2013-05-06 DIAGNOSIS — I252 Old myocardial infarction: Secondary | ICD-10-CM

## 2013-05-06 DIAGNOSIS — Z0000 Encounter for general adult medical examination without abnormal findings: Secondary | ICD-10-CM

## 2013-05-06 MED ORDER — VALACYCLOVIR HCL 1 G PO TABS: 1000.0000 mg | ORAL_TABLET | Freq: Three times a day (TID) | ORAL | Status: DC

## 2013-05-06 MED ORDER — COQ10 100 MG PO CAPS: 1.0000 | ORAL_CAPSULE | Freq: Every day | ORAL | Status: DC

## 2013-05-06 MED ORDER — LIDOCAINE 5 % EX PTCH: 1.0000 | MEDICATED_PATCH | CUTANEOUS | Status: DC

## 2013-05-06 NOTE — Telephone Encounter (Signed)
Re-sent rx into pharmacy.  

## 2013-05-06 NOTE — Patient Instructions (Addendum)
Start valacyclovir for shingles rash as it can shorten the duration and minimize pain. You should keep rash covered. You are contagious until blisters crust over. You may use lidoderm patches for pain relief if needed.  Start CoQ10 enzyme for heart benefits.  Call our office with GI preference.  Return for Tdap vaccine and consider shingles vaccine in future.  You may use Neilmed Sinus Rinse 1-2 times daily for post-nasal drip.   Consider low dose CT of chest to screen for lung cancer-cost is about $300.00.  Pleasure to meet you.  Shingles Shingles (herpes zoster) is an infection that is caused by the same virus that causes chickenpox (varicella). The infection causes a painful skin rash and fluid-filled blisters, which eventually break open, crust over, and heal. It may occur in any area of the body, but it usually affects only one side of the body or face. The pain of shingles usually lasts about 1 month. However, some people with shingles may develop long-term (chronic) pain in the affected area of the body. Shingles often occurs many years after the person had chickenpox. It is more common:  In people older than 50 years.  In people with weakened immune systems, such as those with HIV, AIDS, or cancer.  In people taking medicines that weaken the immune system, such as transplant medicines.  In people under great stress. CAUSES  Shingles is caused by the varicella zoster virus (VZV), which also causes chickenpox. After a person is infected with the virus, it can remain in the person's body for years in an inactive state (dormant). To cause shingles, the virus reactivates and breaks out as an infection in a nerve root. The virus can be spread from person to person (contagious) through contact with open blisters of the shingles rash. It will only spread to people who have not had chickenpox. When these people are exposed to the virus, they may develop chickenpox. They will not develop  shingles. Once the blisters scab over, the person is no longer contagious and cannot spread the virus to others. SYMPTOMS  Shingles shows up in stages. The initial symptoms may be pain, itching, and tingling in an area of the skin. This pain is usually described as burning, stabbing, or throbbing.In a few days or weeks, a painful red rash will appear in the area where the pain, itching, and tingling were felt. The rash is usually on one side of the body in a band or belt-like pattern. Then, the rash usually turns into fluid-filled blisters. They will scab over and dry up in approximately 2 3 weeks. Flu-like symptoms may also occur with the initial symptoms, the rash, or the blisters. These may include:  Fever.  Chills.  Headache.  Upset stomach. DIAGNOSIS  Your caregiver will perform a skin exam to diagnose shingles. Skin scrapings or fluid samples may also be taken from the blisters. This sample will be examined under a microscope or sent to a lab for further testing. TREATMENT  There is no specific cure for shingles. Your caregiver will likely prescribe medicines to help you manage the pain, recover faster, and avoid long-term problems. This may include antiviral drugs, anti-inflammatory drugs, and pain medicines. HOME CARE INSTRUCTIONS   Take a cool bath or apply cool compresses to the area of the rash or blisters as directed. This may help with the pain and itching.   Only take over-the-counter or prescription medicines as directed by your caregiver.   Rest as directed by your caregiver.  Keep your rash and blisters clean with mild soap and cool water or as directed by your caregiver.  Do not pick your blisters or scratch your rash. Apply an anti-itch cream or numbing creams to the affected area as directed by your caregiver.  Keep your shingles rash covered with a loose bandage (dressing).  Avoid skin contact with:  Babies.   Pregnant women.   Children with eczema.    Elderly people with transplants.   People with chronic illnesses, such as leukemia or AIDS.   Wear loose-fitting clothing to help ease the pain of material rubbing against the rash.  Keep all follow-up appointments with your caregiver.If the area involved is on your face, you may receive a referral for follow-up to a specialist, such as an eye doctor (ophthalmologist) or an ear, nose, and throat (ENT) doctor. Keeping all follow-up appointments will help you avoid eye complications, chronic pain, or disability.  SEEK IMMEDIATE MEDICAL CARE IF:   You have facial pain, pain around the eye area, or loss of feeling on one side of your face.  You have ear pain or ringing in your ear.  You have loss of taste.  Your pain is not relieved with prescribed medicines.   Your redness or swelling spreads.   You have more pain and swelling.  Your condition is worsening or has changed.   You have a feveror persistent symptoms for more than 2 3 days.  You have a fever and your symptoms suddenly get worse. MAKE SURE YOU:  Understand these instructions.  Will watch your condition.  Will get help right away if you are not doing well or get worse. Document Released: 08/20/2005 Document Revised: 05/14/2012 Document Reviewed: 04/03/2012 [facility] Patient Information 2014 [place], [place].  Preventive Care for Adults, Male A healthy lifestyle and preventive care can promote health and wellness. Preventive health guidelines for men include the following key practices:  A routine yearly physical is a good way to check with your caregiver about your health and preventative screening. It is a chance to share any concerns and updates on your health, and to receive a thorough exam.  Visit your dentist for a routine exam and preventative care every 6 months. Brush your teeth twice a day and floss once a day. Good oral hygiene prevents tooth decay and gum disease.  The frequency of eye exams is  based on your age, health, family medical history, use of contact lenses, and other factors. Follow your caregiver's recommendations for frequency of eye exams.  Eat a healthy diet. Foods like vegetables, fruits, whole grains, low-fat dairy products, and lean protein foods contain the nutrients you need without too many calories. Decrease your intake of foods high in solid fats, added sugars, and salt. Eat the right amount of calories for you.Get information about a proper diet from your caregiver, if necessary.  Regular physical exercise is one of the most important things you can do for your health. Most adults should get at least 150 minutes of moderate-intensity exercise (any activity that increases your heart rate and causes you to sweat) each week. In addition, most adults need muscle-strengthening exercises on 2 or more days a week.  Maintain a healthy weight. The body mass index (BMI) is a screening tool to identify possible weight problems. It provides an estimate of body fat based on height and weight. Your caregiver can help determine your BMI, and can help you achieve or maintain a healthy weight.For adults 20 years and older:  A BMI below 18.5 is considered underweight.  A BMI of 18.5 to 24.9 is normal.  A BMI of 25 to 29.9 is considered overweight.  A BMI of 30 and above is considered obese.  Maintain normal blood lipids and cholesterol levels by exercising and minimizing your intake of saturated fat. Eat a balanced diet with plenty of fruit and vegetables. Blood tests for lipids and cholesterol should begin at age 78 and be repeated every 5 years. If your lipid or cholesterol levels are high, you are over 50, or you are a high risk for heart disease, you may need your cholesterol levels checked more frequently.Ongoing high lipid and cholesterol levels should be treated with medicines if diet and exercise are not effective.  If you smoke, find out from your caregiver how to quit.  If you do not use tobacco, do not start.  If you choose to drink alcohol, do not exceed 2 drinks per day. One drink is considered to be 12 ounces (355 mL) of beer, 5 ounces (148 mL) of wine, or 1.5 ounces (44 mL) of liquor.  Avoid use of street drugs. Do not share needles with anyone. Ask for help if you need support or instructions about stopping the use of drugs.  High blood pressure causes heart disease and increases the risk of stroke. Your blood pressure should be checked at least every 1 to 2 years. Ongoing high blood pressure should be treated with medicines, if weight loss and exercise are not effective.  If you are 15 to 62 years old, ask your caregiver if you should take aspirin to prevent heart disease.  Diabetes screening involves taking a blood sample to check your fasting blood sugar level. This should be done once every 3 years, after age 40, if you are within normal weight and without risk factors for diabetes. Testing should be considered at a younger age or be carried out more frequently if you are overweight and have at least 1 risk factor for diabetes.  Colorectal cancer can be detected and often prevented. Most routine colorectal cancer screening begins at the age of 30 and continues through age 3. However, your caregiver may recommend screening at an earlier age if you have risk factors for colon cancer. On a yearly basis, your caregiver may provide home test kits to check for hidden blood in the stool. Use of a small camera at the end of a tube, to directly examine the colon (sigmoidoscopy or colonoscopy), can detect the earliest forms of colorectal cancer. Talk to your caregiver about this at age 60, when routine screening begins. Direct examination of the colon should be repeated every 5 to 10 years through age 56, unless early forms of pre-cancerous polyps or small growths are found.  Hepatitis C blood testing is recommended for all people born from [birth year] through 1965 and any  individual with known risks for hepatitis C.  Practice safe sex. Use condoms and avoid high-risk sexual practices to reduce the spread of sexually transmitted infections (STIs). STIs include gonorrhea, chlamydia, syphilis, trichomonas, herpes, HPV, and human immunodeficiency virus (HIV). Herpes, HIV, and HPV are viral illnesses that have no cure. They can result in disability, cancer, and death.  A one-time screening for abdominal aortic aneurysm (AAA) and surgical repair of large AAAs by sound wave imaging (ultrasonography) is recommended for ages 48 to 42 years who are current or former smokers.  Healthy men should no longer receive prostate-specific antigen (PSA) blood tests as part of routine  cancer screening. Consult with your caregiver about prostate cancer screening.  Testicular cancer screening is not recommended for adult males who have no symptoms. Screening includes self-exam, caregiver exam, and other screening tests. Consult with your caregiver about any symptoms you have or any concerns you have about testicular cancer.  Use sunscreen with skin protection factor (SPF) of 30 or more. Apply sunscreen liberally and repeatedly throughout the day. You should seek shade when your shadow is shorter than you. Protect yourself by wearing long sleeves, pants, a wide-brimmed hat, and sunglasses year round, whenever you are outdoors.  Once a month, do a whole body skin exam, using a mirror to look at the skin on your back. Notify your caregiver of new moles, moles that have irregular borders, moles that are larger than a pencil eraser, or moles that have changed in shape or color.  Stay current with required immunizations.  Influenza. You need a dose every fall (or winter). The composition of the flu vaccine changes each year, so being vaccinated once is not enough.  Pneumococcal polysaccharide. You need 1 to 2 doses if you smoke cigarettes or if you have certain chronic medical conditions. You  need 1 dose at age 54 (or older) if you have never been vaccinated.  Tetanus, diphtheria, pertussis (Tdap, Td). Get 1 dose of Tdap vaccine if you are younger than age 14 years, are over 59 and have contact with an infant, are a Research scientist (physical sciences), or simply want to be protected from whooping cough. After that, you need a Td booster dose every 10 years. Consult your caregiver if you have not had at least 3 tetanus and diphtheria-containing shots sometime in your life or have a deep or dirty wound.  HPV. This vaccine is recommended for males 13 through 62 years of age. This vaccine may be given to men 22 through 62 years of age who have not completed the 3 dose series. It is recommended for men through age 21 who have sex with men or whose immune system is weakened because of HIV infection, other illness, or medications. The vaccine is given in 3 doses over 6 months.  Measles, mumps, rubella (MMR). You need at least 1 dose of MMR if you were born in 1957 or later. You may also need a 2nd dose.  Meningococcal. If you are age 73 to 77 years and a Orthoptist living in a residence hall, or have one of several medical conditions, you need to get vaccinated against meningococcal disease. You may also need additional booster doses.  Zoster (shingles). If you are age 56 years or older, you should get this vaccine.  Varicella (chickenpox). If you have never had chickenpox or you were vaccinated but received only 1 dose, talk to your caregiver to find out if you need this vaccine.  Hepatitis A. You need this vaccine if you have a specific risk factor for hepatitis A virus infection, or you simply wish to be protected from this disease. The vaccine is usually given as 2 doses, 6 to 18 months apart.  Hepatitis B. You need this vaccine if you have a specific risk factor for hepatitis B virus infection or you simply wish to be protected from this disease. The vaccine is given in 3 doses, usually over  6 months. Preventative Service / Frequency Ages 69 to 60  Blood pressure check.** / Every 1 to 2 years.  Lipid and cholesterol check.** / Every 5 years beginning at age 61.  Hepatitis  C blood test.** / For any individual with known risks for hepatitis C.  Skin self-exam. / Monthly.  Influenza immunization.** / Every year.  Pneumococcal polysaccharide immunization.** / 1 to 2 doses if you smoke cigarettes or if you have certain chronic medical conditions.  Tetanus, diphtheria, pertussis (Tdap,Td) immunization. / A one-time dose of Tdap vaccine. After that, you need a Td booster dose every 10 years.  HPV immunization. / 3 doses over 6 months, if 26 and younger.  Measles, mumps, rubella (MMR) immunization. / You need at least 1 dose of MMR if you were born in 1957 or later. You may also need a 2nd dose.  Meningococcal immunization. / 1 dose if you are age 66 to 19 years and a Orthoptist living in a residence hall, or have one of several medical conditions, you need to get vaccinated against meningococcal disease. You may also need additional booster doses.  Varicella immunization.** / Consult your caregiver.  Hepatitis A immunization.** / Consult your caregiver. 2 doses, 6 to 18 months apart.  Hepatitis B immunization.** / Consult your caregiver. 3 doses usually over 6 months. Ages 10 to 65  Blood pressure check.** / Every 1 to 2 years.  Lipid and cholesterol check.** / Every 5 years beginning at age 30.  Fecal occult blood test (FOBT) of stool. / Every year beginning at age 56 and continuing until age 58. You may not have to do this test if you get colonoscopy every 10 years.  Flexible sigmoidoscopy** or colonoscopy.** / Every 5 years for a flexible sigmoidoscopy or every 10 years for a colonoscopy beginning at age 58 and continuing until age 50.  Hepatitis C blood test.** / For all people born from [birth year] through 1965 and any individual with known risks for  hepatitis C.  Skin self-exam. / Monthly.  Influenza immunization.** / Every year.  Pneumococcal polysaccharide immunization.** / 1 to 2 doses if you smoke cigarettes or if you have certain chronic medical conditions.  Tetanus, diphtheria, pertussis (Tdap/Td) immunization.** / A one-time dose of Tdap vaccine. After that, you need a Td booster dose every 10 years.  Measles, mumps, rubella (MMR) immunization. / You need at least 1 dose of MMR if you were born in 1957 or later. You may also need a 2nd dose.  Varicella immunization.**/ Consult your caregiver.  Meningococcal immunization.** / Consult your caregiver.  Hepatitis A immunization.** / Consult your caregiver. 2 doses, 6 to 18 months apart.  Hepatitis B immunization.** / Consult your caregiver. 3 doses, usually over 6 months. Ages 61 and over  Blood pressure check.** / Every 1 to 2 years.  Lipid and cholesterol check.**/ Every 5 years beginning at age 5.  Fecal occult blood test (FOBT) of stool. / Every year beginning at age 30 and continuing until age 58. You may not have to do this test if you get colonoscopy every 10 years.  Flexible sigmoidoscopy** or colonoscopy.** / Every 5 years for a flexible sigmoidoscopy or every 10 years for a colonoscopy beginning at age 74 and continuing until age 11.  Hepatitis C blood test.** / For all people born from [birth year] through 1965 and any individual with known risks for hepatitis C.  Abdominal aortic aneurysm (AAA) screening.** / A one-time screening for ages 28 to 68 years who are current or former smokers.  Skin self-exam. / Monthly.  Influenza immunization.** / Every year.  Pneumococcal polysaccharide immunization.** / 1 dose at age 24 (or older) if you have never been  vaccinated.  Tetanus, diphtheria, pertussis (Tdap, Td) immunization. / A one-time dose of Tdap vaccine if you are over 65 and have contact with an infant, are a Research scientist (physical sciences), or simply want to be protected from  whooping cough. After that, you need a Td booster dose every 10 years.  Varicella immunization. ** / Consult your caregiver.  Meningococcal immunization.** / Consult your caregiver.  Hepatitis A immunization. ** / Consult your caregiver. 2 doses, 6 to 18 months apart.  Hepatitis B immunization.** / Check with your caregiver. 3 doses, usually over 6 months. **Family history and personal history of risk and conditions may change your caregiver's recommendations. Document Released: 10/16/2001 Document Revised: 11/12/2011 Document Reviewed: 01/15/2011 [facility] Patient Information 2014 [place], [place].

## 2013-05-06 NOTE — Progress Notes (Signed)
Subjective:     [name] [name] is a 62 y.o. male and is here to establish care. He sees cardiology regularly, no other specialists, has not had PCP. He is concerned about a rash that appeared yesterday on his back. He thinks it may be shingles. He has had shingles in the past in a different location. He has been putting calamine lotion on rash with no improvement. He c/o itching and tingling at site of rash.  History   Social History  . Marital Status: Married    Spouse Name: N/A    Number of Children: N/A  . Years of Education: 12   Occupational History  . Farm and Building services engineer    Social History Main Topics  . Smoking status: Former Smoker -- 1.00 packs/day for 40 years    Types: Cigarettes    Quit date: 12/03/2007  . Smokeless tobacco: Not on file  . Alcohol Use: No  . Drug Use: No  . Sexual Activity: Not on file   Other Topics Concern  . Not on file   Social History Narrative   [name] lives with his wife and her 3 children. He works at [employer] & frequently lifts heavy objects.   Health Maintenance  Topic Date Due  . Colonoscopy  04/09/2001  . Influenza Vaccine  04/12/2014  . Zostavax  04/12/2014  . Tetanus/tdap  04/12/2014    The following portions of the patient's history were reviewed and updated as appropriate: allergies, current medications, past family history, past medical history, past social history, past surgical history and problem list.  Review of Systems Constitutional: negative for fevers, night sweats and weight loss Ears, nose, mouth, throat, and face: positive for nasal congestion and sore throat Respiratory: negative for cough, sputum and wheezing Cardiovascular: negative for chest pain, chest pressure/discomfort, irregular heart beat, lower extremity edema and syncope Integument/breast: positive for rash Behavioral/Psych: negative for excessive alcohol consumption, sleep disturbance and remote Hx of tobacco use   Objective:     BP 120/84  Pulse 64  Temp(Src) 97.4 F (36.3 C) (Oral)  Ht 6' (1.829 m)  Wt 177 lb 12 oz (80.627 kg)  BMI 24.1 kg/m2  SpO2 97% General appearance: alert, cooperative, appears stated age and no distress Head: Normocephalic, without obvious abnormality, atraumatic Eyes: negative findings: lids and lashes normal, conjunctivae and sclerae normal and corneas clear Ears: normal TM's and external ear canals both ears Throat: lips, mucosa, and tongue normal; teeth and gums normal Lungs: clear to auscultation bilaterally Heart: regular rate and rhythm, S1, S2 normal, no murmur, click, rub or gallop Skin: vesicles on red base in dermatomal distribution L scapulae    Assessment:   Shingles Prev care-needs tdap/deferred to next appt.; flu-declined; needs colonoscopy; offered LDCT chest as he is former smoker Hx of MI at 62 yo Hyperlipidemia managed by cardiology   Plan:    1 valtrex, lidoderm patches, counseled re contagion 2 ref to GI for colon cancer screen 3-4 continue to follow cardiology, recommend CoQ10 while on statin   See After Visit Summary for Counseling Recommendations

## 2013-05-15 ENCOUNTER — Encounter: Payer: Self-pay | Admitting: Gastroenterology

## 2013-07-06 ENCOUNTER — Ambulatory Visit (AMBULATORY_SURGERY_CENTER): Payer: Self-pay | Admitting: *Deleted

## 2013-07-06 VITALS — Ht 72.0 in | Wt 182.6 lb

## 2013-07-06 DIAGNOSIS — Z1211 Encounter for screening for malignant neoplasm of colon: Secondary | ICD-10-CM

## 2013-07-06 MED ORDER — NA SULFATE-K SULFATE-MG SULF 17.5-3.13-1.6 GM/177ML PO SOLN: 1.0000 | Freq: Once | ORAL | Status: DC

## 2013-07-06 NOTE — Progress Notes (Signed)
No egg or soy allergy. ewm No previous GI history. ewm No problems with past sedation. ewm No 02 or cpap use. ewm Pt declined emmi video. ewm

## 2013-07-09 ENCOUNTER — Encounter: Payer: Self-pay | Admitting: Gastroenterology

## 2013-07-21 ENCOUNTER — Encounter: Payer: Self-pay | Admitting: Gastroenterology

## 2013-07-21 ENCOUNTER — Ambulatory Visit (AMBULATORY_SURGERY_CENTER): Payer: BC Managed Care – PPO | Admitting: Gastroenterology

## 2013-07-21 VITALS — BP 130/81 | HR 54 | Temp 96.1°F | Resp 16 | Ht 72.0 in | Wt 182.0 lb

## 2013-07-21 DIAGNOSIS — K5730 Diverticulosis of large intestine without perforation or abscess without bleeding: Secondary | ICD-10-CM

## 2013-07-21 DIAGNOSIS — D126 Benign neoplasm of colon, unspecified: Secondary | ICD-10-CM

## 2013-07-21 DIAGNOSIS — Z1211 Encounter for screening for malignant neoplasm of colon: Secondary | ICD-10-CM

## 2013-07-21 DIAGNOSIS — K648 Other hemorrhoids: Secondary | ICD-10-CM

## 2013-07-21 MED ORDER — SODIUM CHLORIDE 0.9 % IV SOLN: 500.0000 mL | INTRAVENOUS | Status: DC

## 2013-07-21 NOTE — Progress Notes (Signed)
Report to pacu rn, vss, bbs=clear 

## 2013-07-21 NOTE — Op Note (Signed)
[facility] [address] [place], [address]   COLONOSCOPY PROCEDURE REPORT  PATIENT: Vuong, Musa  MR#: [id number] BIRTHDATE: 1951-04-11 , 62  yrs. old GENDER: Male ENDOSCOPIST: Louis Meckel, MD REFERRED BY: PROCEDURE DATE:  07/21/2013 PROCEDURE:   Colonoscopy with snare polypectomy First Screening Colonoscopy - Avg.  risk and is 50 yrs.  old or older Yes.  Prior Negative Screening - Now for repeat screening. N/A  History of Adenoma - Now for follow-up colonoscopy & has been > or = to 3 yrs.  N/A  Polyps Removed Today? Yes. ASA CLASS:   Class III INDICATIONS:Average risk patient for colon cancer. MEDICATIONS: MAC sedation, administered by CRNA and Propofol (Diprivan) 360 mg IV  DESCRIPTION OF PROCEDURE:   After the risks benefits and alternatives of the procedure were thoroughly explained, informed consent was obtained.  A digital rectal exam revealed no abnormalities of the rectum.   The [id number]  endoscope was introduced through the anus and advanced to the cecum, which was identified by both the appendix and ileocecal valve. No adverse events experienced.   The quality of the prep was excellent using Suprep  The instrument was then slowly withdrawn as the colon was fully examined.      COLON FINDINGS: A sessile polyp was found in the ascending colon.  A polypectomy was performed using snare cautery.  The resection was complete and the polyp tissue was completely retrieved.   Two sessile polyps measuring 10 mm in size were found in the descending colon.  A polypectomy was performed with a cold snare.  The resection was complete and the polyp tissue was completely retrieved.   A pedunculated polyp measuring 2 cm in size was found in the sigmoid colon.  A polypectomy was performed using snare cautery.  The resection was complete and the polyp tissue was completely retrieved.   Mild diverticulosis was noted in the descending colon.    Hemorrhoids were found.  Retroflexed views revealed no abnormalities. The time to cecum=2 minutes 49 seconds. Withdrawal time=24 minutes 55 seconds.  The scope was withdrawn and the procedure completed. COMPLICATIONS: There were no complications.  ENDOSCOPIC IMPRESSION: 1.   Sessile polyp was found in the ascending colon; polypectomy was performed using snare cautery 2.   Two sessile polyps measuring 10 mm in size were found in the descending colon; polypectomy was performed with a cold snare 3.   Pedunculated polyp measuring 2 cm in size was found in the sigmoid colon; polypectomy was performed using snare cautery 4.   Mild diverticulosis was noted in the descending colon 5.   Hemorrhoids  RECOMMENDATIONS: Colonoscopy 3 years   eSigned:  Louis Meckel, MD 07/21/2013 10:01 AM   cc: Maximino Sarin, MD   PATIENT NAME:  Mamadou, Breon MR#: [id number]

## 2013-07-21 NOTE — Progress Notes (Signed)
Called to room to assist during endoscopic procedure.  Patient ID and intended procedure confirmed with present staff. Received instructions for my participation in the procedure from the performing physician.  

## 2013-07-21 NOTE — Patient Instructions (Signed)
YOU HAD AN ENDOSCOPIC PROCEDURE TODAY AT THE [facility]: Refer to the procedure report that was given to you for any specific questions about what was found during the examination.  If the procedure report does not answer your questions, please call your gastroenterologist to clarify.  If you requested that your care partner not be given the details of your procedure findings, then the procedure report has been included in a sealed envelope for you to review at your convenience later.  YOU SHOULD EXPECT: Some feelings of bloating in the abdomen. Passage of more gas than usual.  Walking can help get rid of the air that was put into your GI tract during the procedure and reduce the bloating. If you had a lower endoscopy (such as a colonoscopy or flexible sigmoidoscopy) you may notice spotting of blood in your stool or on the toilet paper. If you underwent a bowel prep for your procedure, then you may not have a normal bowel movement for a few days.  DIET: Your first meal following the procedure should be a light meal and then it is ok to progress to your normal diet.  A half-sandwich or bowl of soup is an example of a good first meal.  Heavy or fried foods are harder to digest and may make you feel nauseous or bloated.  Likewise meals heavy in dairy and vegetables can cause extra gas to form and this can also increase the bloating.  Drink plenty of fluids but you should avoid alcoholic beverages for 24 hours.  ACTIVITY: Your care partner should take you home directly after the procedure.  You should plan to take it easy, moving slowly for the rest of the day.  You can resume normal activity the day after the procedure however you should NOT DRIVE or use heavy machinery for 24 hours (because of the sedation medicines used during the test).    SYMPTOMS TO REPORT IMMEDIATELY: A gastroenterologist can be reached at any hour.  During normal business hours, 8:30 AM to 5:00 PM Monday through Friday,  call [phone].  After hours and on weekends, please call the GI answering service at [phone] who will take a message and have the physician on call contact you.   Following lower endoscopy (colonoscopy or flexible sigmoidoscopy):  Excessive amounts of blood in the stool  Significant tenderness or worsening of abdominal pains  Swelling of the abdomen that is new, acute  Fever of 100F or higher  FOLLOW UP: If any biopsies were taken you will be contacted by phone or by letter within the next 1-3 weeks.  Call your gastroenterologist if you have not heard about the biopsies in 3 weeks.  Our staff will call the home number listed on your records the next business day following your procedure to check on you and address any questions or concerns that you may have at that time regarding the information given to you following your procedure. This is a courtesy call and so if there is no answer at the home number and we have not heard from you through the emergency physician on call, we will assume that you have returned to your regular daily activities without incident.  SIGNATURES/CONFIDENTIALITY: You and/or your care partner have signed paperwork which will be entered into your electronic medical record.  These signatures attest to the fact that that the information above on your After Visit Summary has been reviewed and is understood.  Full responsibility of the confidentiality of this   discharge information lies with you and/or your care-partner.  Recommendations See procedure report  

## 2013-07-21 NOTE — Progress Notes (Signed)
Patient did not have preoperative order for IV antibiotic SSI prophylaxis. (G8918)  Patient did not experience any of the following events: a burn prior to discharge; a fall within the facility; wrong site/side/patient/procedure/implant event; or a hospital transfer or hospital admission upon discharge from the facility. (G8907)  

## 2013-07-22 ENCOUNTER — Telehealth: Payer: Self-pay

## 2013-07-22 NOTE — Telephone Encounter (Signed)
  Follow up Call-  Call back number 07/21/2013  Post procedure Call Back phone  # [phone]  Permission to leave phone message Yes     Patient questions:  Do you have a fever, pain , or abdominal swelling? no Pain Score  0 *  Have you tolerated food without any problems? yes  Have you been able to return to your normal activities? yes  Do you have any questions about your discharge instructions: Diet   no Medications  no Follow up visit  no  Do you have questions or concerns about your Care? no  Actions: * If pain score is 4 or above: No action needed, pain <4.  No problems per the pt. Maw

## 2013-07-28 ENCOUNTER — Encounter: Payer: Self-pay | Admitting: Gastroenterology

## 2013-07-29 ENCOUNTER — Encounter: Payer: Self-pay | Admitting: Nurse Practitioner

## 2013-07-29 DIAGNOSIS — K5730 Diverticulosis of large intestine without perforation or abscess without bleeding: Secondary | ICD-10-CM | POA: Insufficient documentation

## 2013-07-29 DIAGNOSIS — K648 Other hemorrhoids: Secondary | ICD-10-CM | POA: Insufficient documentation

## 2013-07-29 DIAGNOSIS — Z86010 Personal history of colonic polyps: Secondary | ICD-10-CM | POA: Insufficient documentation

## 2013-10-19 ENCOUNTER — Encounter: Payer: Self-pay | Admitting: Nurse Practitioner

## 2013-10-19 ENCOUNTER — Ambulatory Visit (INDEPENDENT_AMBULATORY_CARE_PROVIDER_SITE_OTHER): Payer: BC Managed Care – PPO | Admitting: Nurse Practitioner

## 2013-10-19 VITALS — BP 121/76 | HR 60 | Temp 97.7°F | Resp 16 | Ht 72.0 in | Wt 186.0 lb

## 2013-10-19 DIAGNOSIS — J029 Acute pharyngitis, unspecified: Secondary | ICD-10-CM

## 2013-10-19 NOTE — Progress Notes (Signed)
   Subjective:    Patient ID: [name] [name], male    DOB: [birth year], 63 y.o.   MRN: [id number]  Sore Throat  This is a new problem. The current episode started 1 to 4 weeks ago (1w). The problem has been gradually improving. Neither side of throat is experiencing more pain than the other. There has been no fever. The pain is moderate (c/o sternal cp w/cough, resolving). Associated symptoms include congestion (chest), coughing (dry) and a hoarse voice. Pertinent negatives include no abdominal pain, diarrhea, ear pain, headaches, plugged ear sensation, neck pain, shortness of breath, swollen glands, trouble swallowing or vomiting. He has tried nothing for the symptoms.      Review of Systems  Constitutional: Positive for fatigue. Negative for fever, chills, activity change and appetite change.  HENT: Positive for congestion (chest), hoarse voice, sore throat and voice change (hoarse). Negative for ear pain, postnasal drip, sinus pressure and trouble swallowing.   Respiratory: Positive for cough (dry) and chest tightness (resolving). Negative for shortness of breath and wheezing.   Cardiovascular: Positive for chest pain (sternal cp when coughs-resolving).  Gastrointestinal: Negative for nausea, vomiting, abdominal pain and diarrhea.  Musculoskeletal: Negative for back pain and neck pain.  Neurological: Negative for headaches.       Objective:   Physical Exam  Vitals reviewed. Constitutional: He is oriented to person, place, and time. He appears well-developed and well-nourished. No distress.  HENT:  Head: Normocephalic and atraumatic.  Eyes: Conjunctivae are normal. Right eye exhibits no discharge. Left eye exhibits no discharge.  Neck: Normal range of motion. Neck supple. No thyromegaly present.  Cardiovascular: Normal rate, regular rhythm and normal heart sounds.   No murmur heard. Pulmonary/Chest: Effort normal and breath sounds normal. No respiratory distress. He has no wheezes. He  has no rales.  Lymphadenopathy:    He has no cervical adenopathy.  Neurological: He is alert and oriented to person, place, and time.  Skin: Skin is warm and dry.  Psychiatric: He has a normal mood and affect. His behavior is normal. Thought content normal.          Assessment & Plan:  1. Sore throat, likely viral 1 wk, dry cough, hoarse, afebrile  - Upper Respiratory Culture-pending - POCT Rapid Strep A-neg  See pt instructions.

## 2013-10-19 NOTE — Progress Notes (Signed)
Pre visit review using our clinic review tool, if applicable. No additional management support is needed unless otherwise documented below in the visit note. 

## 2013-10-19 NOTE — Patient Instructions (Signed)
This is likely a viral sore throat/upper respiratory infection. However, if your culture comes back growing bacteria, I will call in an antibiotic. In the meantime, start Neilmed sinus rinses daily if you have nasal congestion. You may use benzocaine throat lozenges or throat spray for comfort. Rest, sip fluids every hour. If you develop fever or fatigue is persistent beyond 72 hours, let us know. Feel better!  Sore Throat A sore throat is pain, burning, irritation, or scratchiness of the throat. There is often pain or tenderness when swallowing or talking. A sore throat may be accompanied by other symptoms, such as coughing, sneezing, fever, and swollen neck glands. A sore throat is often the first sign of another sickness, such as a cold, flu, strep throat, or mononucleosis (commonly known as mono). Most sore throats go away without medical treatment. CAUSES  The most common causes of a sore throat include:  A viral infection, such as a cold, flu, or mono.  A bacterial infection, such as strep throat, tonsillitis, or whooping cough.  Seasonal allergies.  Dryness in the air.  Irritants, such as smoke or pollution.  Gastroesophageal reflux disease (GERD). HOME CARE INSTRUCTIONS   Only take over-the-counter medicines as directed by your caregiver.  Drink enough fluids to keep your urine clear or pale yellow.  Rest as needed.  Try using throat sprays, lozenges, or sucking on hard candy to ease any pain (if older than 4 years or as directed).  Sip warm liquids, such as broth, herbal tea, or warm water with honey to relieve pain temporarily. You may also eat or drink cold or frozen liquids such as frozen ice pops.  Gargle with salt water (mix 1 tsp salt with 8 oz of water).  Do not smoke and avoid secondhand smoke.  Put a cool-mist humidifier in your bedroom at night to moisten the air. You can also turn on a hot shower and sit in the bathroom with the door closed for 5 10  minutes. SEEK IMMEDIATE MEDICAL CARE IF:  You have difficulty breathing.  You are unable to swallow fluids, soft foods, or your saliva.  You have increased swelling in the throat.  Your sore throat does not get better in 7 days.  You have nausea and vomiting.  You have a fever or persistent symptoms for more than 2 3 days.  You have a fever and your symptoms suddenly get worse. MAKE SURE YOU:   Understand these instructions.  Will watch your condition.  Will get help right away if you are not doing well or get worse. Document Released: 09/27/2004 Document Revised: 08/06/2012 Document Reviewed: 04/27/2012 [facility] Patient Information 2014 [place], [place].

## 2013-10-22 LAB — CULTURE, UPPER RESPIRATORY: Organism ID, Bacteria: NORMAL

## 2013-11-06 ENCOUNTER — Encounter: Payer: Self-pay | Admitting: Cardiovascular Disease

## 2013-11-06 ENCOUNTER — Ambulatory Visit (INDEPENDENT_AMBULATORY_CARE_PROVIDER_SITE_OTHER): Payer: BC Managed Care – PPO | Admitting: Cardiovascular Disease

## 2013-11-06 VITALS — BP 134/84 | HR 64 | Ht 72.0 in | Wt 186.8 lb

## 2013-11-06 DIAGNOSIS — I2510 Atherosclerotic heart disease of native coronary artery without angina pectoris: Secondary | ICD-10-CM

## 2013-11-06 DIAGNOSIS — I252 Old myocardial infarction: Secondary | ICD-10-CM

## 2013-11-06 DIAGNOSIS — E785 Hyperlipidemia, unspecified: Secondary | ICD-10-CM

## 2013-11-06 LAB — LIPID PANEL
CHOLESTEROL: 148 mg/dL (ref 0–200)
HDL: 57.3 mg/dL (ref >39.00)
LDL CALC: 70 mg/dL (ref 0–99)
TRIGLYCERIDES: 102 mg/dL (ref 0.0–149.0)
Total CHOL/HDL Ratio: 3
VLDL: 20.4 mg/dL (ref 0.0–40.0)

## 2013-11-06 LAB — HEPATIC FUNCTION PANEL
ALBUMIN: 3.7 g/dL (ref 3.5–5.2)
ALT: 26 U/L (ref 0–53)
AST: 21 U/L (ref 0–37)
Alkaline Phosphatase: 67 U/L (ref 39–117)
BILIRUBIN DIRECT: 0.1 mg/dL (ref 0.0–0.3)
TOTAL PROTEIN: 6.5 g/dL (ref 6.0–8.3)
Total Bilirubin: 0.9 mg/dL (ref 0.3–1.2)

## 2013-11-06 NOTE — Progress Notes (Signed)
    HPI:  63 year old gentleman presented for followup evaluation. The patient has coronary artery disease and he presented with an anterior wall MI in 2009. He was treated with a bare-metal stent in the LAD and had normalization of LV function after his myocardial infarction.   Lipids were last checked in March 2014 and showed cholesterol 139, triglycerides 74, HDL 55, and LDL 69.  He continues to do well from a symptomatic perspective. He's had no chest pain, dyspnea, edema, or palpitations. He does complain of generalized fatigue. He thinks he may have sleep apnea, but his symptoms are not very pronounced. He denies excessive sleepiness. He's been busy with work, but not as physically active over the winter months. Most of the year he rides a bike to work and is quite active.  Outpatient Encounter Prescriptions as of 11/06/2013  Medication Sig  . aspirin 81 MG tablet Take 81 mg by mouth daily.    . carvedilol (COREG) 6.25 MG tablet TAKE 1 TABLET BY MOUTH TWICE A DAY  . nitroGLYCERIN (NITROSTAT) 0.4 MG SL tablet Place 0.4 mg under the tongue every 5 (five) minutes as needed.    . simvastatin (ZOCOR) 40 MG tablet TAKE ONE TABLET BY MOUTH DAILY AT BEDTIME  . [DISCONTINUED] Coenzyme Q10 (COQ10) 100 MG CAPS Take 1 capsule by mouth daily.    No Known Allergies  Past Medical History  Diagnosis Date  . Coronary artery disease   . Hyperlipidemia   . History of tobacco use   . Chicken pox   . Myocardial infarction 12-08-2007    stent  . Hypertension   . History of shingles     ROS: Since his last visit, he had shingles affecting the right upper back. He underwent a colonoscopy that revealed some polyps. Otherwise no pertinent issues to report. Otherwise negative except as per HPI  BP 134/84  Pulse 64  Ht 6' (1.829 m)  Wt 186 lb 12.8 oz (84.732 kg)  BMI 25.33 kg/m2  PHYSICAL EXAM: Pt is alert and oriented, NAD HEENT: normal Neck: JVP - normal, carotids 2+= without bruits Lungs: CTA  bilaterally CV: RRR without murmur or gallop Abd: soft, NT, Positive BS, no hepatomegaly Ext: no C/C/E, distal pulses intact and equal Skin: warm/dry no rash  EKG:  Sinus rhythm with occasional PAC, otherwise within normal limits. Heart rate 64 beats per minute.  ASSESSMENT AND PLAN: 1. Coronary artery disease with history of old MI. No anginal symptoms. He remains CCS class I. He will continue on aspirin, carvedilol, and simvastatin. I will see him back in one year.  2. Hyperlipidemia. Lipids from one year ago reviewed. Will schedule repeat lipids and LFTs.  For followup I will see him back in one year. He will call if any problems occur.  Sherren Mocha 11/06/2013 9:46 AM

## 2013-11-06 NOTE — Patient Instructions (Signed)
Your physician recommends that you continue on your current medications as directed. Please refer to the Current Medication list given to you today.  Your physician recommends that you have labs today: Lipid, Liver  Your physician wants you to follow-up in: 1 year with Dr. Burt Knack. You will receive a reminder letter in the mail two months in advance. If you don't receive a letter, please call our office to schedule the follow-up appointment.

## 2013-11-11 ENCOUNTER — Other Ambulatory Visit: Payer: Self-pay | Admitting: Cardiovascular Disease

## 2014-02-25 ENCOUNTER — Other Ambulatory Visit: Payer: Self-pay | Admitting: Cardiovascular Disease

## 2014-11-09 ENCOUNTER — Encounter: Payer: Self-pay | Admitting: Cardiovascular Disease

## 2014-11-09 ENCOUNTER — Ambulatory Visit (INDEPENDENT_AMBULATORY_CARE_PROVIDER_SITE_OTHER): Payer: BLUE CROSS/BLUE SHIELD | Admitting: Cardiovascular Disease

## 2014-11-09 VITALS — BP 118/76 | HR 46 | Ht 72.0 in | Wt 189.0 lb

## 2014-11-09 DIAGNOSIS — I2510 Atherosclerotic heart disease of native coronary artery without angina pectoris: Secondary | ICD-10-CM

## 2014-11-09 DIAGNOSIS — E785 Hyperlipidemia, unspecified: Secondary | ICD-10-CM

## 2014-11-09 MED ORDER — CARVEDILOL 3.125 MG PO TABS: 3.1250 mg | ORAL_TABLET | Freq: Two times a day (BID) | ORAL | Status: DC

## 2014-11-09 MED ORDER — SIMVASTATIN 20 MG PO TABS: 20.0000 mg | ORAL_TABLET | Freq: Every day | ORAL | Status: DC

## 2014-11-09 NOTE — Patient Instructions (Addendum)
Your physician has recommended you make the following change in your medication:  1. DECREASE Carvedilol to 3.125mg  take one by mouth twice a day 2. DECREASE Simvastatin to 20mg  take one by mouth every evening  Your physician recommends that you return for a FASTING LIPID and LIVER in 2 MONTHS--nothing to eat or drink after midnight, lab opens at 7:30 AM (01/10/15)  Your physician wants you to follow-up in: 1 YEAR with Dr Burt Knack.  You will receive a reminder letter in the mail two months in advance. If you don't receive a letter, please call our office to schedule the follow-up appointment.

## 2014-11-09 NOTE — Progress Notes (Signed)
Cardiology Office Note   Date:  11/09/2014   ID:  [name] [name], DOB 1951-07-17, MRN [id number]  PCP:  Irene Pap, NP  Cardiologist:  Sherren Mocha, MD    No chief complaint [name] file.    History of Present Illness: [name] [name] is a 64 y.o. male who presents for follow-up of CAD. He presented with an anterior MI in 2009, treated with primary PCI using a bare metal stent in the LAD. LV systolic function was preserved at the time of his infarct. He's had no recurrent angina or ischemic events since his initial presentation.  He continues to do well from a cardiac perspective. He denies chest pain, chest pressure, shortness of breath, heart palpitations, or leg swelling. He has had pains in his elbows and arms intermittently. He worries about long-term effects of his statin drug. He takes coenzyme Q 10 but hasn't appreciated much change since starting this.  Past Medical History  Diagnosis Date  . Coronary artery disease   . Hyperlipidemia   . History of tobacco use   . Chicken pox   . Myocardial infarction 12-08-2007    stent  . Hypertension   . History of shingles     Past Surgical History  Procedure Laterality Date  . Knee arthroscopy      bilateral  . Coronary stent placement    . Vasectomy    . Wisdom tooth extraction      Current Outpatient Prescriptions  Medication Sig Dispense Refill  . aspirin 81 MG tablet Take 81 mg by mouth daily.      . carvedilol (COREG) 6.25 MG tablet TAKE 1 TABLET BY MOUTH TWICE A DAY 180 tablet 3  . Coenzyme Q10 (CO Q 10) 100 MG CAPS Take 1 capsule by mouth daily.    . nitroGLYCERIN (NITROSTAT) 0.4 MG SL tablet Place 0.4 mg under the tongue every 5 (five) minutes as needed.      . simvastatin (ZOCOR) 40 MG tablet TAKE ONE TABLET BY MOUTH DAILY AT BEDTIME 90 tablet 3   No current facility-administered medications for this visit.   Allergies:   Review of patient's allergies indicates no known allergies.   Social History:  The  patient  reports that he quit smoking about 6 years ago. His smoking use included Cigarettes. He has a 40 pack-year smoking history. He has never used smokeless tobacco. He reports that he does not drink alcohol or use illicit drugs.   Family History:  The patient's  family history includes Heart disease in his father, mother, and paternal uncle; Stroke in his mother. There is no history of Colon cancer, Rectal cancer, or Stomach cancer.   ROS:  Please see the history of present illness.  Otherwise, review of systems is positive for joint and muscle pains, gynecomastia.  All other systems are reviewed and negative.   PHYSICAL EXAM: VS:  BP 118/76 mmHg  Pulse 46  Ht 6' (1.829 m)  Wt 189 lb (85.73 kg)  BMI 25.63 kg/m2 , BMI Body mass index is 25.63 kg/(m^2). GEN: Well nourished, well developed, in no acute distress HEENT: normal Neck: no JVD, no masses, no carotid bruits Cardiac: bradycardic and regular without murmur or gallop     Respiratory:  clear to auscultation bilaterally, normal work of breathing GI: soft, nontender, nondistended, + BS MS: no deformity or atrophy Ext: no pretibial edema Skin: warm and dry, no rash Neuro:  Strength and sensation are intact Psych: euthymic mood, full affect  EKG:  EKG is ordered today. The ekg ordered today shows marked sinus bradycardia 46 bpm. Otherwise within normal limits.  Recent Labs: No results found for requested labs within last 365 days.   Lipid Panel     Component Value Date/Time   CHOL 148 11/06/2013 0954   TRIG 102.0 11/06/2013 0954   HDL 57.30 11/06/2013 0954   CHOLHDL 3 11/06/2013 0954   VLDL 20.4 11/06/2013 0954   LDLCALC 70 11/06/2013 0954      Wt Readings from Last 3 Encounters:  11/09/14 189 lb (85.73 kg)  11/06/13 186 lb 12.8 oz (84.732 kg)  10/19/13 186 lb (84.369 kg)    ASSESSMENT AND PLAN: 1.  CAD, native vessel: Stable without symptoms of angina. Continue [name] aspirin. I will see him back in one year.  2.  Hyperlipidemia: I reviewed the patient's most recent lipids. He has a lot of concerns about side effects of his statin drug. We discussed options. I recommended that we decrease simvastatin to 20 mg daily. Will check lipids and LFTs in 2 months. Also discussed the importance of diet and exercise.  3. Marked sinus bradycardia: While he is asymptomatic, I think we should reduce carvedilol to 3.125 mg twice daily to prevent issues related to bradycardia.  Current medicines are reviewed with the patient today.  The patient does not have concerns regarding medicines.  The following changes have been made:    Decrease coreg to 3.125 mg BID  Decrease simvastatin to 20 mg daily  Labs/ tests ordered today include:  No orders of the defined types were placed in this encounter.    Disposition:   FU one year  Signed, Sherren Mocha, MD  11/09/2014 9:34 AM    [address], [place], [place]  [address] Phone: [phone]; Fax: [phone]

## 2015-01-10 ENCOUNTER — Other Ambulatory Visit (INDEPENDENT_AMBULATORY_CARE_PROVIDER_SITE_OTHER): Payer: BLUE CROSS/BLUE SHIELD | Admitting: *Deleted

## 2015-01-10 DIAGNOSIS — I2510 Atherosclerotic heart disease of native coronary artery without angina pectoris: Secondary | ICD-10-CM

## 2015-01-10 DIAGNOSIS — E785 Hyperlipidemia, unspecified: Secondary | ICD-10-CM | POA: Diagnosis not present

## 2015-01-10 LAB — LIPID PANEL
CHOL/HDL RATIO: 3
Cholesterol: 161 mg/dL (ref 0–200)
HDL: 60.4 mg/dL (ref >39.00)
LDL Cholesterol: 86 mg/dL (ref 0–99)
NONHDL: 100.6
Triglycerides: 72 mg/dL (ref 0.0–149.0)
VLDL: 14.4 mg/dL (ref 0.0–40.0)

## 2015-01-10 LAB — HEPATIC FUNCTION PANEL
ALBUMIN: 3.8 g/dL (ref 3.5–5.2)
ALT: 24 U/L (ref 0–53)
AST: 20 U/L (ref 0–37)
Alkaline Phosphatase: 61 U/L (ref 39–117)
Bilirubin, Direct: 0.1 mg/dL (ref 0.0–0.3)
Total Bilirubin: 0.5 mg/dL (ref 0.2–1.2)
Total Protein: 6.4 g/dL (ref 6.0–8.3)

## 2015-01-13 ENCOUNTER — Telehealth: Payer: Self-pay | Admitting: Cardiovascular Disease

## 2015-01-13 NOTE — Telephone Encounter (Signed)
Informed pt of lab results. Pt verbalized understanding. 

## 2015-01-13 NOTE — Telephone Encounter (Signed)
Follow Up    Pt is calling following up of blood work results. Please call.

## 2015-01-27 ENCOUNTER — Encounter: Payer: Self-pay | Admitting: Nurse Practitioner

## 2015-01-27 ENCOUNTER — Ambulatory Visit (INDEPENDENT_AMBULATORY_CARE_PROVIDER_SITE_OTHER): Payer: BLUE CROSS/BLUE SHIELD | Admitting: Nurse Practitioner

## 2015-01-27 VITALS — BP 125/81 | HR 56 | Temp 97.8°F | Resp 16 | Wt 180.0 lb

## 2015-01-27 DIAGNOSIS — M542 Cervicalgia: Secondary | ICD-10-CM | POA: Insufficient documentation

## 2015-01-27 DIAGNOSIS — B029 Zoster without complications: Secondary | ICD-10-CM | POA: Diagnosis not present

## 2015-01-27 MED ORDER — VALACYCLOVIR HCL 1 G PO TABS: 1000.0000 mg | ORAL_TABLET | Freq: Three times a day (TID) | ORAL | Status: DC

## 2015-01-27 NOTE — Progress Notes (Signed)
Subjective:     [name] [name] is a 64 y.o. male c/o tingling across shoulders & gynecomastia. tingling across shoulders: -he thinks he has shingles again. He denies rash, myalgia, fatigue, fever. States tingling started yesterday & is in similar area as previous shingles rash. He was seen in 2014 for same complaint: had rash eruption over L scapula.  Rash resolved w/valtrex.  Gynecomastia: onset several yrs, gradual change. He has been on beta blocker since MI in 2009. Discussed beta blockers can contribute to gynecomastia, but is recommended drug after MI. He is following w/cardiology. Dose carvedilol recently decreased as well as statin. Pt denies muscle cramps, but feels like he has to stretch shoulders, back & neck daily-more noticeable than in past.    The following portions of the patient's history were reviewed and updated as appropriate: allergies, current medications, past medical history, past social history, past surgical history and problem list.  Review of Systems Constitutional: negative for fatigue, fevers and night sweats Genitourinary:negative for testicular swelling, nodules Musculoskeletal:negative for stiff joints    Objective:    BP 125/81 mmHg  Pulse 56  Temp(Src) 97.8 F (36.6 C) (Oral)  Resp 16  Wt 180 lb (81.647 kg)  SpO2 95% BP 125/81 mmHg  Pulse 56  Temp(Src) 97.8 F (36.6 C) (Oral)  Resp 16  Wt 180 lb (81.647 kg)  SpO2 95% General appearance: alert, cooperative, appears stated age and no distress Head: Normocephalic, without obvious abnormality, atraumatic Eyes: negative findings: lids and lashes normal and conjunctivae and sclerae normal Back: mild kyphosis, no spinal tenderness, no spinal accessory tenderness. FROM neck. Lungs: clear to auscultation bilaterally Heart: regular rate and rhythm, S1, S2 normal, no murmur, click, rub or gallop Skin: Skin color, texture, turgor normal. No rashes or lesions Neurologic: Grossly normal Breasts: no  discretely palpable breast tissue, no nipple enlargement      Assessment:Plan     1. Shingles low suspicion, given area crosses midline RC:BULAGTXMIWO Hold-try ibuprophen first - valACYclovir (VALTREX) 1000 MG tablet; Take 1 tablet (1,000 mg total) by mouth 3 (three) times daily.  Dispense: 21 tablet; Refill: 0  2. Cervicalgia Mild kyphosis 400 mg ibuprophen bid X 2 dys w/food, then PRN Exercises Natural anti-inflamms F/u if no improvement  See instructions for complete plan

## 2015-01-27 NOTE — Progress Notes (Signed)
Pre visit review using our clinic review tool, if applicable. No additional management support is needed unless otherwise documented below in the visit note. 

## 2015-01-27 NOTE — Patient Instructions (Signed)
I suspect you have some arthritis in neck or upper back causing tingling sensation.  Consider taking 400 mg ibuprophen twice today & tomorrow. If no relief or you develop rash, please start valtrex. If you have relief, take 400 mg ibuprophen as needed up to 3 times daily. Always take this medicine with food. Also, you may consider Natural anti-inflammatories: Fresh ginger tea daily (grate 1-2 TBLS fresh ginger in & steep in hot water for 5 minutes); handful tart cherries or 1/2 cup tart cherry juice daily; 1000-2000 mg curmarin capsules daily.  Start 1 supplement at a time & take for 2 weeks before adding a second or third, if needed.  Visit you tube for these exercise regimens: "physio Neck Exercises: Stretch & Relieve routine" by Mauri Brooklyn; "Pilates Seated Stretch" by [employer]; "Physio med Neck & Upper body stretch Exercises-Occupational Physiotherapy".  If no improvement, let me know I will order xrays of neck & upper back.  Nice to see you!

## 2015-05-06 ENCOUNTER — Ambulatory Visit (INDEPENDENT_AMBULATORY_CARE_PROVIDER_SITE_OTHER): Payer: BLUE CROSS/BLUE SHIELD | Admitting: Family Medicine

## 2015-05-06 ENCOUNTER — Encounter: Payer: Self-pay | Admitting: Family Medicine

## 2015-05-06 VITALS — BP 124/77 | HR 80 | Temp 98.8°F | Resp 18 | Ht 72.0 in | Wt 183.0 lb

## 2015-05-06 DIAGNOSIS — L509 Urticaria, unspecified: Secondary | ICD-10-CM | POA: Diagnosis not present

## 2015-05-06 LAB — COMPLETE METABOLIC PANEL WITH GFR
ALBUMIN: 3.9 g/dL (ref 3.6–5.1)
ALK PHOS: 67 U/L (ref 40–115)
ALT: 21 U/L (ref 9–46)
AST: 20 U/L (ref 10–35)
BUN: 13 mg/dL (ref 7–25)
CALCIUM: 9 mg/dL (ref 8.6–10.3)
CHLORIDE: 104 mmol/L (ref 98–110)
CO2: 26 mmol/L (ref 20–31)
Creat: 0.89 mg/dL (ref 0.70–1.25)
GFR, Est Non African American: >89 mL/min (ref >=60)
Glucose, Bld: 94 mg/dL (ref 65–99)
Potassium: 4 mmol/L (ref 3.5–5.3)
Sodium: 141 mmol/L (ref 135–146)
Total Bilirubin: 0.8 mg/dL (ref 0.2–1.2)
Total Protein: 5.9 g/dL — ABNORMAL LOW (ref 6.1–8.1)

## 2015-05-06 LAB — CBC WITH DIFFERENTIAL/PLATELET
BASOS ABS: 0 10*3/uL (ref 0.0–0.1)
Basophils Relative: 0 % (ref 0–1)
Eosinophils Absolute: 0.1 10*3/uL (ref 0.0–0.7)
Eosinophils Relative: 1 % (ref 0–5)
HEMATOCRIT: 46.4 % (ref 39.0–52.0)
Hemoglobin: 15.8 g/dL (ref 13.0–17.0)
LYMPHS ABS: 1.8 10*3/uL (ref 0.7–4.0)
LYMPHS PCT: 16 % (ref 12–46)
MCH: 31.6 pg (ref 26.0–34.0)
MCHC: 34.1 g/dL (ref 30.0–36.0)
MCV: 92.8 fL (ref 78.0–100.0)
MPV: 10.6 fL (ref 8.6–12.4)
Monocytes Absolute: 1.1 10*3/uL — ABNORMAL HIGH (ref 0.1–1.0)
Monocytes Relative: 10 % (ref 3–12)
Neutro Abs: 8.2 10*3/uL — ABNORMAL HIGH (ref 1.7–7.7)
Neutrophils Relative %: 73 % (ref 43–77)
Platelets: 221 10*3/uL (ref 150–400)
RBC: 5 MIL/uL (ref 4.22–5.81)
RDW: 13.5 % (ref 11.5–15.5)
WBC: 11.3 10*3/uL — ABNORMAL HIGH (ref 4.0–10.5)

## 2015-05-06 MED ORDER — HYDROXYZINE PAMOATE 25 MG PO CAPS: 25.0000 mg | ORAL_CAPSULE | Freq: Every evening | ORAL | Status: DC | PRN

## 2015-05-06 MED ORDER — METHYLPREDNISOLONE ACETATE 80 MG/ML IJ SUSP
80.0000 mg | Freq: Once | INTRAMUSCULAR | Status: AC
  Administered 2015-05-06: 80 mg via INTRAMUSCULAR

## 2015-05-06 NOTE — Patient Instructions (Signed)
Hives Hives are itchy, red, swollen areas of the skin. They can vary in size and location on your body. Hives can come and go for hours or several days (acute hives) or for several weeks (chronic hives). Hives do not spread from person to person (noncontagious). They may get worse with scratching, exercise, and emotional stress. CAUSES   Allergic reaction to food, additives, or drugs.  Infections, including the common cold.  Illness, such as vasculitis, lupus, or thyroid disease.  Exposure to sunlight, heat, or cold.  Exercise.  Stress.  Contact with chemicals. SYMPTOMS   Red or white swollen patches on the skin. The patches may change size, shape, and location quickly and repeatedly.  Itching.  Swelling of the hands, feet, and face. This may occur if hives develop deeper in the skin. DIAGNOSIS  Your caregiver can usually tell what is wrong by performing a physical exam. Skin or blood tests may also be done to determine the cause of your hives. In some cases, the cause cannot be determined. TREATMENT  Mild cases usually get better with medicines such as antihistamines. Severe cases may require an emergency epinephrine injection. If the cause of your hives is known, treatment includes avoiding that trigger.  HOME CARE INSTRUCTIONS   Avoid causes that trigger your hives.  Take antihistamines as directed by your caregiver to reduce the severity of your hives. Non-sedating or low-sedating antihistamines are usually recommended. Do not drive while taking an antihistamine.  Take any other medicines prescribed for itching as directed by your caregiver.  Wear loose-fitting clothing.  Keep all follow-up appointments as directed by your caregiver. SEEK MEDICAL CARE IF:   You have persistent or severe itching that is not relieved with medicine.  You have painful or swollen joints. SEEK IMMEDIATE MEDICAL CARE IF:   You have a fever.  Your tongue or lips are swollen.  You have  trouble breathing or swallowing.  You feel tightness in the throat or chest.  You have abdominal pain. These problems may be the first sign of a life-threatening allergic reaction. Call your local emergency services (911 in [place] MAKE SURE YOU:   Understand these instructions.  Will watch your condition.  Will get help right away if you are not doing well or get worse. Document Released: 08/20/2005 Document Revised: 08/25/2013 Document Reviewed: 11/13/2011 [facility] Patient Information 2015 [place], [place]. This information is not intended to replace advice given to you by your health care provider. Make sure you discuss any questions you have with your health care provider.  If rash returns you may need an extended course of steroids, I would like to see you next week

## 2015-05-06 NOTE — Addendum Note (Signed)
Addended by: Ralph Dowdy on: 05/06/2015 04:48 PM   Modules accepted: Orders

## 2015-05-06 NOTE — Progress Notes (Signed)
Subjective:    Patient ID: [name] [name], male    DOB: May 29, 1951, 64 y.o.   MRN: [id number]  HPI  Rash: Patient presents with a 24-hour history of waxing and waning pruritic erythemic rash. Patient states he has never had a rash similar to this in the past. He does not particularly feel he's had sensitive skin. He states yesterday after work, he rode his bike home, and then noticed a rash distributed in his groin area initially. Patient states that it spread to his hands, his chest, his arms, eye and neck. No one else in the household has a similar rash, he shares a house with his wife. He denies any recent travel, or hotel stays. He denies any changes in soaps, detergents, colognes, shampoos etc. He denies any purchase or use of new clothing or sheets. Patient denies any fevers, acute illness, difficulty with urination, changes in stools or abdominal pain. He reports that the splotches are waxing and waning, change in location and is extremely pruritic. Patient is not diabetic, he is on a statin. He denies any changes in medications. He denies any changes or new foods in his diet. He denies any recent outdoor activities in the woods, or working around weeds or brush.  Past Medical History  Diagnosis Date  . Coronary artery disease   . Hyperlipidemia   . History of tobacco use   . Chicken pox   . Myocardial infarction 12-08-2007    stent  . Hypertension   . History of shingles    No Known Allergies Social History   Social History  . Marital Status: Married    Spouse Name: N/A  . Number of Children: N/A  . Years of Education: 12   Occupational History  . Farm and Engineer, maintenance ([employer])    Social History Main Topics  . Smoking status: Former Smoker -- 1.00 packs/day for 40 years    Types: Cigarettes    Quit date: 12/03/2007  . Smokeless tobacco: Never Used  . Alcohol Use: No  . Drug Use: No  . Sexual Activity: Not on file   Other Topics Concern  . Not on file   Social History  Narrative   Mr. Brendle lives with his wife and her 3 children. He works at [employer] & frequently lifts heavy objects.    Review of Systems Negative, with the exception of above mentioned in HPI     Objective:   Physical Exam BP 124/77 mmHg  Pulse 80  Temp(Src) 98.8 F (37.1 C) (Temporal)  Resp 18  Ht 6' (1.829 m)  Wt 183 lb (83.008 kg)  BMI 24.81 kg/m2  SpO2 96% Gen: Afebrile. No acute distress. Nontoxic in appearance.  HENT: AT. [place].  Bilateral eyes without injections or icterus. MMM.  Eyes:Pupils Equal Round Reactive to light, Extraocular movements intact, Conjunctiva without redness, discharge or icterus. Skin: no purpura. Petechiae present in the right axillary region. Multiple areas of erythematous whelps over face, posterior neck, back, chest and legs.      Assessment & Plan:  1. Urticaria - No identified changes to cause reaction.  - DepoMedrol IM today, advised pt if reaction continues or returns may need extended course of prednisone.  - Vistaril QHS prescribed - Allegra recommended for daytime if needed - Labs to rule out viral/Liver etiology of pruritis  - CBC w/Diff - COMPLETE METABOLIC PANEL WITH GFR - AVS on urticaria and emergency care if lip/mouth involvement or patients feels airway is affected. - f/u  1 week, if no improvement.

## 2015-05-06 NOTE — Progress Notes (Signed)
Pre visit review using our clinic review tool, if applicable. No additional management support is needed unless otherwise documented below in the visit note. 

## 2015-05-10 ENCOUNTER — Telehealth: Payer: Self-pay | Admitting: Family Medicine

## 2015-05-10 NOTE — Telephone Encounter (Signed)
Please call pt, his labs showed  Mild elevation in white count, which can be expected with his reaction. He did have mildly low serum protein as well, which I would like to follow up with him on with additional labs. This could have been a false low and will need repeated, but if accurate could be signs of of illness or malnutrition. Please have schedule at his earliest convenience. Thanks.

## 2016-02-10 ENCOUNTER — Ambulatory Visit (INDEPENDENT_AMBULATORY_CARE_PROVIDER_SITE_OTHER): Payer: BLUE CROSS/BLUE SHIELD | Admitting: Cardiovascular Disease

## 2016-02-10 ENCOUNTER — Encounter: Payer: Self-pay | Admitting: Cardiovascular Disease

## 2016-02-10 VITALS — BP 122/74 | HR 68 | Ht 72.0 in | Wt 175.0 lb

## 2016-02-10 DIAGNOSIS — E785 Hyperlipidemia, unspecified: Secondary | ICD-10-CM | POA: Diagnosis not present

## 2016-02-10 DIAGNOSIS — Z125 Encounter for screening for malignant neoplasm of prostate: Secondary | ICD-10-CM

## 2016-02-10 DIAGNOSIS — I2510 Atherosclerotic heart disease of native coronary artery without angina pectoris: Secondary | ICD-10-CM

## 2016-02-10 MED ORDER — CARVEDILOL 3.125 MG PO TABS: 3.1250 mg | ORAL_TABLET | Freq: Two times a day (BID) | ORAL | Status: DC

## 2016-02-10 MED ORDER — SIMVASTATIN 20 MG PO TABS: 20.0000 mg | ORAL_TABLET | Freq: Every day | ORAL | Status: DC

## 2016-02-10 NOTE — Progress Notes (Signed)
Cardiology Office Note Date:  02/10/2016   ID:  [name] [name], DOB Jan 27, 1951, MRN [id number]  PCP:  Irene Pap, NP  Cardiologist:  Sherren Mocha, MD    Chief Complaint  Patient presents with  . Coronary Artery Disease   History of Present Illness: [name] [name] is a 65 y.o. male who presents for follow-up of CAD. He presented with an anterior MI in 2009, treated with primary PCI using a bare metal stent in the LAD. LV systolic function was preserved at the time of his infarct. He's had no recurrent angina or ischemic events since his initial presentation. He's physically active without exertional symptoms. He hasn't been exercising as much as he had in the past. Was previously riding his bike a lot. Today, he denies symptoms of palpitations, chest pain, shortness of breath, orthopnea, PND, lower extremity edema, dizziness, or syncope.  Past Medical History  Diagnosis Date  . Coronary artery disease   . Hyperlipidemia   . History of tobacco use   . Chicken pox   . Myocardial infarction ([place]) 12-08-2007    stent  . Hypertension   . History of shingles     Past Surgical History  Procedure Laterality Date  . Knee arthroscopy      bilateral  . Coronary stent placement    . Vasectomy    . Wisdom tooth extraction      Current Outpatient Prescriptions  Medication Sig Dispense Refill  . aspirin 81 MG tablet Take 81 mg by mouth daily.      . carvedilol (COREG) 3.125 MG tablet Take 1 tablet (3.125 mg total) by mouth 2 (two) times daily. 180 tablet 3  . Coenzyme Q10 (CO Q 10) 100 MG CAPS Take 1 capsule by mouth daily.    . nitroGLYCERIN (NITROSTAT) 0.4 MG SL tablet Place 0.4 mg under the tongue every 5 (five) minutes as needed.      . simvastatin (ZOCOR) 20 MG tablet Take 1 tablet (20 mg total) by mouth at bedtime. 90 tablet 3   No current facility-administered medications for this visit.   Allergies:   Review of patient's allergies indicates no known allergies.   Social  History:  The patient  reports that he quit smoking about 8 years ago. His smoking use included Cigarettes. He has a 40 pack-year smoking history. He has never used smokeless tobacco. He reports that he does not drink alcohol or use illicit drugs.   Family History:  The patient's family history includes Heart disease in his father, mother, and paternal uncle; Stroke in his mother. There is no history of Colon cancer, Rectal cancer, or Stomach cancer.   ROS:  Please see the history of present illness.  All other systems are reviewed and negative.   PHYSICAL EXAM: VS:  BP 122/74 mmHg  Pulse 68  Ht 6' (1.829 m)  Wt 175 lb (79.379 kg)  BMI 23.73 kg/m2 , BMI Body mass index is 23.73 kg/(m^2). GEN: Well nourished, well developed, in no acute distress HEENT: normal Neck: no JVD, no masses. No carotid bruits Cardiac: RRR without murmur or gallop                Respiratory:  clear to auscultation bilaterally, normal work of breathing GI: soft, nontender, nondistended, + BS MS: no deformity or atrophy Ext: no pretibial edema, pedal pulses 2+= bilaterally Skin: warm and dry, no rash Neuro:  Strength and sensation are intact Psych: euthymic mood, full affect  EKG:  EKG is  ordered today. The ekg ordered today shows NSR 68 bpm, within normal limits  Recent Labs: 05/06/2015: ALT 21; BUN 13; Creat 0.89; Hemoglobin 15.8; Platelets 221; Potassium 4.0; Sodium 141   Lipid Panel     Component Value Date/Time   CHOL 161 01/10/2015 0741   TRIG 72.0 01/10/2015 0741   HDL 60.40 01/10/2015 0741   CHOLHDL 3 01/10/2015 0741   VLDL 14.4 01/10/2015 0741   LDLCALC 86 01/10/2015 0741      Wt Readings from Last 3 Encounters:  02/10/16 175 lb (79.379 kg)  05/06/15 183 lb (83.008 kg)  01/27/15 180 lb (81.647 kg)    ASSESSMENT AND PLAN: 1.  CAD, native vessel, without symptoms of angina: Medications are reviewed and no changes are made today. The patient is doing very well. I will see him back in one  year.  2. Hyperlipidemia: He is treated with simvastatin 20 mg. Last lipids reviewed. Will repeat a lipid panel and LFTs. Myalgias have resolved since last year's evaluation.   The patient continues to do well from a cardiac perspective. I will see him back in one year. He requests that we add a PSA to his blood work as he would like to have this drawn. I will forward the results of his primary care physician.  Current medicines are reviewed with the patient today.  The patient does not have concerns regarding medicines.  Labs/ tests ordered today include:   Orders Placed This Encounter  Procedures  . Lipid panel  . Hepatic function panel  . PSA  . EKG 12-Lead   Disposition:   FU one year  Signed, Sherren Mocha, MD  02/10/2016 5:45 PM    [facility] [address], [place], [place]  [address] Phone: [phone]; Fax: [phone]

## 2016-02-10 NOTE — Patient Instructions (Signed)
Medication Instructions:  Your physician recommends that you continue on your current medications as directed. Please refer to the Current Medication list given to you today.  Labwork: Your physician recommends that you return for a FASTING LIPID, LIVER and PSA--nothing to eat or drink after midnight, lab opens at 7:30 AM  Testing/Procedures: No new orders.   Follow-Up: Your physician wants you to follow-up in: 1 YEAR with Dr Burt Knack.  You will receive a reminder letter in the mail two months in advance. If you don't receive a letter, please call our office to schedule the follow-up appointment.   Any Other Special Instructions Will Be Listed Below (If Applicable).     If you need a refill on your cardiac medications before your next appointment, please call your pharmacy.

## 2016-02-13 ENCOUNTER — Other Ambulatory Visit (INDEPENDENT_AMBULATORY_CARE_PROVIDER_SITE_OTHER): Payer: BLUE CROSS/BLUE SHIELD | Admitting: *Deleted

## 2016-02-13 DIAGNOSIS — E785 Hyperlipidemia, unspecified: Secondary | ICD-10-CM | POA: Diagnosis not present

## 2016-02-13 DIAGNOSIS — Z125 Encounter for screening for malignant neoplasm of prostate: Secondary | ICD-10-CM | POA: Diagnosis not present

## 2016-02-13 DIAGNOSIS — I2510 Atherosclerotic heart disease of native coronary artery without angina pectoris: Secondary | ICD-10-CM

## 2016-02-13 LAB — LIPID PANEL
CHOLESTEROL: 157 mg/dL (ref 125–200)
HDL: 51 mg/dL (ref >=40)
LDL Cholesterol: 87 mg/dL (ref <130)
Total CHOL/HDL Ratio: 3.1 Ratio (ref <=5.0)
Triglycerides: 94 mg/dL (ref <150)
VLDL: 19 mg/dL (ref <30)

## 2016-02-13 LAB — HEPATIC FUNCTION PANEL
ALK PHOS: 63 U/L (ref 40–115)
ALT: 27 U/L (ref 9–46)
AST: 23 U/L (ref 10–35)
Albumin: 3.9 g/dL (ref 3.6–5.1)
BILIRUBIN DIRECT: 0.1 mg/dL (ref <=0.2)
BILIRUBIN INDIRECT: 0.2 mg/dL (ref 0.2–1.2)
Total Bilirubin: 0.3 mg/dL (ref 0.2–1.2)
Total Protein: 5.9 g/dL — ABNORMAL LOW (ref 6.1–8.1)

## 2016-02-13 LAB — PSA: PSA: 1.1 ng/mL (ref <=4.00)

## 2016-02-13 NOTE — Addendum Note (Signed)
Addended by: Eulis Foster on: 02/13/2016 07:33 AM   Modules accepted: Orders

## 2016-05-04 HISTORY — PX: CATARACT EXTRACTION, BILATERAL: SHX1313

## 2016-05-14 ENCOUNTER — Encounter: Payer: Self-pay | Admitting: Gastroenterology

## 2016-06-12 ENCOUNTER — Encounter: Payer: Self-pay | Admitting: Gastroenterology

## 2016-07-16 ENCOUNTER — Ambulatory Visit (AMBULATORY_SURGERY_CENTER): Payer: Self-pay | Admitting: *Deleted

## 2016-07-16 VITALS — Ht 72.0 in | Wt 183.0 lb

## 2016-07-16 DIAGNOSIS — Z86010 Personal history of colonic polyps: Secondary | ICD-10-CM

## 2016-07-16 MED ORDER — NA SULFATE-K SULFATE-MG SULF 17.5-3.13-1.6 GM/177ML PO SOLN: ORAL | 0 refills | Status: DC

## 2016-07-16 NOTE — Progress Notes (Signed)
Patient denies any allergies to eggs or soy. Patient denies any problems with anesthesia/sedation. Patient denies any oxygen use at home and does not take any diet/weight loss medications.  

## 2016-07-20 ENCOUNTER — Encounter: Payer: Self-pay | Admitting: Gastroenterology

## 2016-08-02 ENCOUNTER — Ambulatory Visit (AMBULATORY_SURGERY_CENTER): Payer: BLUE CROSS/BLUE SHIELD | Admitting: Gastroenterology

## 2016-08-02 ENCOUNTER — Encounter: Payer: Self-pay | Admitting: Gastroenterology

## 2016-08-02 VITALS — BP 130/82 | HR 60 | Temp 96.6°F | Resp 20 | Ht 72.0 in | Wt 183.0 lb

## 2016-08-02 DIAGNOSIS — D122 Benign neoplasm of ascending colon: Secondary | ICD-10-CM

## 2016-08-02 DIAGNOSIS — Z86010 Personal history of colonic polyps: Secondary | ICD-10-CM | POA: Diagnosis not present

## 2016-08-02 DIAGNOSIS — D120 Benign neoplasm of cecum: Secondary | ICD-10-CM

## 2016-08-02 DIAGNOSIS — D123 Benign neoplasm of transverse colon: Secondary | ICD-10-CM

## 2016-08-02 MED ORDER — SODIUM CHLORIDE 0.9 % IV SOLN: 500.0000 mL | INTRAVENOUS | Status: DC

## 2016-08-02 NOTE — Op Note (Signed)
[facility] Patient Name: [name] [name] Procedure Date: 08/02/2016 8:36 AM MRN: [id number] Endoscopist: Mallie Mussel L. Loletha Carrow , MD Age: 65 Referring MD:  Date of Birth: [birth year] Gender: Male Account #: [id number] Procedure:                Colonoscopy Indications:              Surveillance: Personal history of adenomatous                            polyps on last colonoscopy 3 years ago - 07/2013 (4                            tubular adenomas, one was 7mm) Medicines:                Monitored Anesthesia Care Procedure:                Pre-Anesthesia Assessment:                           - Prior to the procedure, a History and Physical                            was performed, and patient medications and                            allergies were reviewed. The patient's tolerance of                            previous anesthesia was also reviewed. The risks                            and benefits of the procedure and the sedation                            options and risks were discussed with the patient.                            All questions were answered, and informed consent                            was obtained. Anticoagulants: The patient has taken                            aspirin. It was decided not to withhold this                            medication prior to the procedure. ASA Grade                            Assessment: II - A patient with mild systemic                            disease. After reviewing the risks and benefits,  the patient was deemed in satisfactory condition to                            undergo the procedure.                           After obtaining informed consent, the colonoscope                            was passed under direct vision. Throughout the                            procedure, the patient's blood pressure, pulse, and                            oxygen saturations were monitored continuously. The                        Model CF-HQ190L [id number]) scope was introduced                            through the anus and advanced to the the cecum,                            identified by appendiceal orifice and ileocecal                            valve. The ileocecal valve, appendiceal orifice,                            and rectum were photographed. The quality of the                            bowel preparation was good. The colonoscopy was                            performed without difficulty. The patient tolerated                            the procedure well. The bowel preparation used was                            SUPREP. The quality of the bowel preparation was                            evaluated using the BBPS [facility] Bowel Preparation                            Scale) with scores of: Right Colon = 2, Transverse                            Colon = 2 and Left Colon = 2. The total BBPS score  equals 6. Scope In: 8:47:13 AM Scope Out: 9:04:38 AM Scope Withdrawal Time: 0 hours 13 minutes 57 seconds  Total Procedure Duration: 0 hours 17 minutes 25 seconds  Findings:                 A 2 mm polyp was found in the ileocecal valve. The                            polyp was sessile. The polyp was removed with a                            piecemeal technique using a cold biopsy forceps.                            Resection and retrieval were complete.                           Two sessile polyps were found in the hepatic                            flexure and proximal ascending colon. The polyps                            were 4 mm in size. These polyps were removed with a                            cold snare. Resection and retrieval were complete.                           Internal hemorrhoids were found during                            retroflexion. The hemorrhoids were medium-sized and                            Grade I (internal hemorrhoids that do not  prolapse).                           The exam was otherwise without abnormality on                            direct and retroflexion views.                           Multiple medium-mouthed diverticula were found in                            the left colon. Complications:            No immediate complications. Estimated blood loss:                            None. Estimated Blood Loss:     Estimated blood loss: none. Recommendation:           - Resume aspirin in 3 days  at prior dose.                           - Patient has a contact number available for                            emergencies. The signs and symptoms of potential                            delayed complications were discussed with the                            patient. Return to normal activities tomorrow.                            Written discharge instructions were provided to the                            patient.                           - Resume previous diet.                           - Continue present medications.                           - Await pathology results.                           - Repeat colonoscopy is recommended for                            surveillance. The colonoscopy date will be                            determined after pathology results from today's                            exam become available for review. Kailan Carmen L. Loletha Carrow, MD 08/02/2016 9:11:51 AM This report has been signed electronically.

## 2016-08-02 NOTE — Progress Notes (Signed)
Called to room to assist during endoscopic procedure.  Patient ID and intended procedure confirmed with present staff. Received instructions for my participation in the procedure from the performing physician.  

## 2016-08-02 NOTE — Patient Instructions (Signed)
YOU HAD AN ENDOSCOPIC PROCEDURE TODAY AT [facility]:   Refer to the procedure report that was given to you for any specific questions about what was found during the examination.  If the procedure report does not answer your questions, please call your gastroenterologist to clarify.  If you requested that your care partner not be given the details of your procedure findings, then the procedure report has been included in a sealed envelope for you to review at your convenience later.  YOU SHOULD EXPECT: Some feelings of bloating in the abdomen. Passage of more gas than usual.  Walking can help get rid of the air that was put into your GI tract during the procedure and reduce the bloating. If you had a lower endoscopy (such as a colonoscopy or flexible sigmoidoscopy) you may notice spotting of blood in your stool or on the toilet paper. If you underwent a bowel prep for your procedure, you may not have a normal bowel movement for a few days.  Please Note:  You might notice some irritation and congestion in your nose or some drainage.  This is from the oxygen used during your procedure.  There is no need for concern and it should clear up in a day or so.  SYMPTOMS TO REPORT IMMEDIATELY:   Following lower endoscopy (colonoscopy or flexible sigmoidoscopy):  Excessive amounts of blood in the stool  Significant tenderness or worsening of abdominal pains  Swelling of the abdomen that is new, acute  Fever of 100F or higher  For urgent or emergent issues, a gastroenterologist can be reached at any hour by calling [phone].   DIET:  We do recommend a small meal at first, but then you may proceed to your regular diet.  Drink plenty of fluids but you should avoid alcoholic beverages for 24 hours.Try to increase the fiber in your diet, and drink plenty of water.  ACTIVITY:  You should plan to take it easy for the rest of today and you should NOT DRIVE or use heavy machinery until  tomorrow (because of the sedation medicines used during the test).    FOLLOW UP: Our staff will call the number listed on your records the next business day following your procedure to check on you and address any questions or concerns that you may have regarding the information given to you following your procedure. If we do not reach you, we will leave a message.  However, if you are feeling well and you are not experiencing any problems, there is no need to return our call.  We will assume that you have returned to your regular daily activities without incident.  If any biopsies were taken you will be contacted by phone or by letter within the next 1-3 weeks.  Please call us at [phone] if you have not heard about the biopsies in 3 weeks.    SIGNATURES/CONFIDENTIALITY: You and/or your care partner have signed paperwork which will be entered into your electronic medical record.  These signatures attest to the fact that that the information above on your After Visit Summary has been reviewed and is understood.  Full responsibility of the confidentiality of this discharge information lies with you and/or your care-partner. Resume your aspirin in 3 days with previous dose per Dr. Loletha Carrow.  Read all of the handouts given to you by your recovery room nurse.   Thank-you for choosing [place] for your healthcare needs today.

## 2016-08-02 NOTE — Progress Notes (Signed)
Report to PACU, RN, vss, BBS= Clear.  

## 2016-08-03 ENCOUNTER — Telehealth: Payer: Self-pay

## 2016-08-03 NOTE — Telephone Encounter (Signed)
  Follow up Call-  Call back number 08/02/2016  Post procedure Call Back phone  # [phone]  Permission to leave phone message Yes  Some recent data might be hidden     Patient questions:  Do you have a fever, pain , or abdominal swelling? No. Pain Score  0 *  Have you tolerated food without any problems? Yes.    Have you been able to return to your normal activities? Yes.    Do you have any questions about your discharge instructions: Diet   No. Medications  No. Follow up visit  No.  Do you have questions or concerns about your Care? No.  Actions: * If pain score is 4 or above: No action needed, pain <4.

## 2016-08-09 ENCOUNTER — Encounter: Payer: Self-pay | Admitting: Gastroenterology

## 2016-08-10 ENCOUNTER — Encounter: Payer: Self-pay | Admitting: Family Medicine

## 2016-12-26 ENCOUNTER — Telehealth: Payer: Self-pay | Admitting: Family Medicine

## 2016-12-26 NOTE — Telephone Encounter (Signed)
Patient Name: [name] [name]  DOB: [birth year]    Initial Comment Caller states, he is needing an appointment - he is having pain when coughing, and chest pain- had the flu earlier. Verified    Nurse Assessment  Nurse: Mallie Mussel, RN, Alveta Heimlich Date/Time Eilene Ghazi Time): 12/26/2016 1:55:28 PM  Confirm and document reason for call. If symptomatic, describe symptoms. ---Caller states that he has chest pain, but only when he is coughing. He states that the chest pain is moderate when he coughs. His cough is not productive. He has had the cough for more than 3 weeks. Denies difficulty breathing.  Does the patient have any new or worsening symptoms? ---Yes  Will a triage be completed? ---Yes  Related visit to physician within the last 2 weeks? ---No  Does the PT have any chronic conditions? (i.e. diabetes, asthma, etc.) ---Yes  List chronic conditions. ---Hypercholesterolemia, Prior MI,  Is this a behavioral health or substance abuse call? ---No     Guidelines    Guideline Title Affirmed Question Affirmed Notes  Cough - Acute Non-Productive Chest pain (Exception: MILD central chest pain, present only when coughing)    Final Disposition User   Go to ED Now Mallie Mussel, RN, Alveta Heimlich    Comments  Call was lost as I was finishing up the triage. He did not want to go to ER, he only wants to make an appointment to be seen. I had told him that I will forward this to the office, but didn't get a chance to tell him that someone from there will call him back. I attempted x 2 to call him back. Each time, I got a message "The prescriber you are trying to call is not accepting calls at this time."   Referrals  REFERRED TO PCP OFFICE   Disagree/Comply: Disagree  Disagree/Comply Reason: Disagree with instructions

## 2016-12-26 NOTE — Telephone Encounter (Signed)
Called and spoke with patient per Dr Raoul Pitch due to patients history of MI and complaints of chest pain he needs to go to ER for evaluation of chest pain. Patient verbalized understanding.

## 2017-01-04 ENCOUNTER — Encounter: Payer: Self-pay | Admitting: Cardiovascular Disease

## 2017-01-04 ENCOUNTER — Ambulatory Visit (INDEPENDENT_AMBULATORY_CARE_PROVIDER_SITE_OTHER): Payer: BLUE CROSS/BLUE SHIELD | Admitting: Cardiovascular Disease

## 2017-01-04 ENCOUNTER — Ambulatory Visit
Admission: RE | Admit: 2017-01-04 | Discharge: 2017-01-04 | Disposition: A | Discharge status: Home / Self Care | Payer: BLUE CROSS/BLUE SHIELD | Source: Ambulatory Visit | Attending: Cardiovascular Disease | Admitting: Cardiovascular Disease

## 2017-01-04 VITALS — BP 128/74 | HR 56 | Ht 72.0 in | Wt 187.8 lb

## 2017-01-04 DIAGNOSIS — R059 Cough, unspecified: Secondary | ICD-10-CM

## 2017-01-04 DIAGNOSIS — E785 Hyperlipidemia, unspecified: Secondary | ICD-10-CM

## 2017-01-04 DIAGNOSIS — R05 Cough: Secondary | ICD-10-CM

## 2017-01-04 DIAGNOSIS — R0789 Other chest pain: Secondary | ICD-10-CM

## 2017-01-04 NOTE — Patient Instructions (Signed)
Medication Instructions:  Your physician recommends that you continue on your current medications as directed. Please refer to the Current Medication list given to you today.  Labwork: Your physician recommends that you return for a FASTING LIPID and LIVER--nothing to eat or drink after midnight, lab opens at 7:30.   Testing/Procedures: A chest x-ray takes a picture of the organs and structures inside the chest, including the heart, lungs, and blood vessels. This test can show several things, including, whether the heart is enlarges; whether fluid is building up in the lungs; and whether pacemaker / defibrillator leads are still in place. [facility])  Follow-Up: Your physician wants you to follow-up in: 1 YEAR with Dr Burt Knack.  You will receive a reminder letter in the mail two months in advance. If you don't receive a letter, please call our office to schedule the follow-up appointment.   Any Other Special Instructions Will Be Listed Below (If Applicable).     If you need a refill on your cardiac medications before your next appointment, please call your pharmacy.

## 2017-01-04 NOTE — Progress Notes (Signed)
Cardiology Office Note Date:  01/04/2017   ID:  Burr Soffer, DOB [birth year], MRN [id number]  PCP:  Howard Pouch, DO  Cardiologist:  Sherren Mocha, MD    Chief Complaint  Patient presents with  . Follow-up     History of Present Illness: Kekai Geter is a 66 y.o. male who presents for follow-up of coronary artery disease. He presented with an anterior MI in 2009, treated with primary PCI using a bare metal stent in the LAD. LV systolic function was preserved at the time of his infarct. He's had no recurrent angina or ischemic events since his initial presentation.  He is here alone today. He had a flu-like illness approximately 2 months ago and began having left strenal pain at that time associated with cough. He also feels discomfort with palpation of the chest. No symptoms with lifting or with aerobic activity. Describes a 'low-level' discomfort in the chest most of the time. No shortness of breath.Continues to have a nonproductive cough.   Past Medical History:  Diagnosis Date  . Chicken pox   . Coronary artery disease   . History of shingles   . History of tobacco use   . Hyperlipidemia   . Hypertension   . Myocardial infarction [facility]) 12-08-2007   stent    Past Surgical History:  Procedure Laterality Date  . CATARACT EXTRACTION, BILATERAL  05/2016  . CORONARY STENT PLACEMENT    . KNEE ARTHROSCOPY     bilateral  . VASECTOMY    . WISDOM TOOTH EXTRACTION      Current Outpatient Prescriptions  Medication Sig Dispense Refill  . aspirin 81 MG tablet Take 81 mg by mouth daily.      . carvedilol (COREG) 3.125 MG tablet Take 1 tablet (3.125 mg total) by mouth 2 (two) times daily. 180 tablet 3  . Coenzyme Q10 (CO Q 10) 100 MG CAPS Take 1 capsule by mouth daily.    . nitroGLYCERIN (NITROSTAT) 0.4 MG SL tablet Place 0.4 mg under the tongue every 5 (five) minutes as needed for chest pain.     . simvastatin (ZOCOR) 20 MG tablet Take 1 tablet (20 mg total) by mouth at  bedtime. 90 tablet 3   Current Facility-Administered Medications  Medication Dose Route Frequency Provider Last Rate Last Dose  . 0.9 %  sodium chloride infusion  500 mL Intravenous Continuous Nelida Meuse III, MD        Allergies:   Patient has no known allergies.   Social History:  The patient  reports that he quit smoking about 9 years ago. His smoking use included Cigarettes. He has a 40.00 pack-year smoking history. He has never used smokeless tobacco. He reports that he does not drink alcohol or use drugs.   Family History:  The patient's family history includes Heart disease in his father, mother, and paternal uncle; Stroke in his mother.    ROS:  Please see the history of present illness.  Otherwise, review of systems is positive for nonproductive cough.  All other systems are reviewed and negative.    PHYSICAL EXAM: VS:  BP 128/74   Pulse (!) 56   Ht 6' (1.829 m)   Wt 187 lb 12 oz (85.2 kg)   SpO2 95%   BMI 25.46 kg/m  , BMI Body mass index is 25.46 kg/m. GEN: Well nourished, well developed, in no acute distress  HEENT: normal  Neck: no JVD, no masses. No carotid bruits Cardiac: RRR without murmur or gallop  Respiratory:  clear to auscultation bilaterally, normal work of breathing Chest: Tenderness over the left sternal area GI: soft, nontender, nondistended, + BS MS: no deformity or atrophy  Ext: no pretibial edema, pedal pulses 2+= bilaterally Skin: warm and dry, no rash Neuro:  Strength and sensation are intact Psych: euthymic mood, full affect  EKG:  EKG is ordered today. The ekg ordered today shows sinus bradycardia 56 bpm, occasional PVC, age-indeterminate septal infarct  Recent Labs: 02/13/2016: ALT 27   Lipid Panel     Component Value Date/Time   CHOL 157 02/13/2016 0733   TRIG 94 02/13/2016 0733   HDL 51 02/13/2016 0733   CHOLHDL 3.1 02/13/2016 0733   VLDL 19 02/13/2016 0733   LDLCALC 87 02/13/2016 0733      Wt Readings from  Last 3 Encounters:  01/04/17 187 lb 12 oz (85.2 kg)  08/02/16 183 lb (83 kg)  07/16/16 183 lb (83 kg)     ASSESSMENT AND PLAN: 1.  CAD, native vessel, without symptoms of angina: Patient seems to be doing okay from a cardiac perspective. He will continue on aspirin, carvedilol, and simvastatin. His chest discomfort is clearly noncardiac.  2. Hyperlipidemia: Continues on simvastatin. His weight is up about 10 pounds. We discussed lifestyle modification. He states his weight generally fluctuates 10 pounds between winter and summer months. Will update labs.  3. Chest pain: The patient has left-sided chest discomfort associated with cough and palpation. Appears to be chest wall pain. Will check a chest x-ray to make sure there is no infectious process.  Current medicines are reviewed with the patient today.  The patient does not have concerns regarding medicines.  Labs/ tests ordered today include:  No orders of the defined types were placed in this encounter.  Disposition:   FU one year  Signed, Sherren Mocha, MD  01/04/2017 10:02 AM    [facility] [address], [place], [place]  [address] Phone: [phone]; Fax: [phone]

## 2017-01-07 ENCOUNTER — Other Ambulatory Visit: Payer: BLUE CROSS/BLUE SHIELD | Admitting: *Deleted

## 2017-01-07 DIAGNOSIS — E785 Hyperlipidemia, unspecified: Secondary | ICD-10-CM

## 2017-01-07 LAB — HEPATIC FUNCTION PANEL
ALBUMIN: 4 g/dL (ref 3.6–4.8)
ALT: 21 IU/L (ref 0–44)
AST: 19 IU/L (ref 0–40)
Alkaline Phosphatase: 71 IU/L (ref 39–117)
BILIRUBIN TOTAL: 0.8 mg/dL (ref 0.0–1.2)
Bilirubin, Direct: 0.19 mg/dL (ref 0.00–0.40)
Total Protein: 6.2 g/dL (ref 6.0–8.5)

## 2017-01-07 LAB — LIPID PANEL
Chol/HDL Ratio: 2.9 ratio (ref 0.0–5.0)
Cholesterol, Total: 158 mg/dL (ref 100–199)
HDL: 54 mg/dL (ref >39)
LDL Calculated: 81 mg/dL (ref 0–99)
TRIGLYCERIDES: 113 mg/dL (ref 0–149)
VLDL CHOLESTEROL CAL: 23 mg/dL (ref 5–40)

## 2017-03-26 ENCOUNTER — Ambulatory Visit: Payer: BLUE CROSS/BLUE SHIELD | Admitting: Cardiovascular Disease

## 2017-03-28 ENCOUNTER — Ambulatory Visit (INDEPENDENT_AMBULATORY_CARE_PROVIDER_SITE_OTHER): Payer: BLUE CROSS/BLUE SHIELD | Admitting: Family Medicine

## 2017-03-28 ENCOUNTER — Encounter: Payer: Self-pay | Admitting: Family Medicine

## 2017-03-28 VITALS — BP 108/66 | HR 73 | Temp 98.0°F | Resp 16 | Ht 72.0 in | Wt 188.5 lb

## 2017-03-28 DIAGNOSIS — M7989 Other specified soft tissue disorders: Secondary | ICD-10-CM | POA: Diagnosis not present

## 2017-03-28 DIAGNOSIS — T63461A Toxic effect of venom of wasps, accidental (unintentional), initial encounter: Secondary | ICD-10-CM | POA: Diagnosis not present

## 2017-03-28 MED ORDER — METHYLPREDNISOLONE ACETATE 40 MG/ML IJ SUSP
40.0000 mg | Freq: Once | INTRAMUSCULAR | Status: AC
  Administered 2017-03-28: 40 mg via INTRAMUSCULAR

## 2017-03-28 NOTE — Progress Notes (Signed)
OFFICE VISIT  03/28/2017   CC:  Wasp stings.  HPI:    Patient is a 66 y.o.  male who presents for hand pain and swelling s/p multiple wasp stings (3) that occurred yesterday. Applied ice right away, took benadryl today x 1 dose. Swelling has not diminished any.  Itches like crazy.  Minimal pain. Denies prior rx to wasp stings.  No SOB, wheezing, fatigue, lightheadedness, or swelling of face/tongue.   Past Medical History:  Diagnosis Date  . Chicken pox   . Coronary artery disease   . History of shingles   . History of tobacco use   . Hyperlipidemia   . Hypertension   . Myocardial infarction [facility]) 12-08-2007   stent    Past Surgical History:  Procedure Laterality Date  . CATARACT EXTRACTION, BILATERAL  05/2016  . CORONARY STENT PLACEMENT    . KNEE ARTHROSCOPY     bilateral  . VASECTOMY    . WISDOM TOOTH EXTRACTION      Outpatient Medications Prior to Visit  Medication Sig Dispense Refill  . aspirin 81 MG tablet Take 81 mg by mouth daily.      . carvedilol (COREG) 3.125 MG tablet Take 1 tablet (3.125 mg total) by mouth 2 (two) times daily. 180 tablet 3  . Coenzyme Q10 (CO Q 10) 100 MG CAPS Take 1 capsule by mouth daily.    . nitroGLYCERIN (NITROSTAT) 0.4 MG SL tablet Place 0.4 mg under the tongue every 5 (five) minutes as needed for chest pain.     . simvastatin (ZOCOR) 20 MG tablet Take 1 tablet (20 mg total) by mouth at bedtime. 90 tablet 3  . 0.9 %  sodium chloride infusion      No facility-administered medications prior to visit.     No Known Allergies  ROS As per HPI  PE: Blood pressure 108/66, pulse 73, temperature 98 F (36.7 C), temperature source Oral, resp. rate 16, height 6' (1.829 m), weight 188 lb 8 oz (85.5 kg), SpO2 94 %. Gen: Alert, well appearing.  Patient is oriented to person, place, time, and situation. AFFECT: pleasant, lucid thought and speech. Face: no swelling. L hand: dorsal aspect with generalized swelling, +warmth, mild erythema.   No tenderness to palpation. Wrist, hand/fingers ROM intact w/out pain.  No joint pain/swelling.  LABS:    Chemistry      Component Value Date/Time   NA 141 05/06/2015 1536   K 4.0 05/06/2015 1536   CL 104 05/06/2015 1536   CO2 26 05/06/2015 1536   BUN 13 05/06/2015 1536   CREATININE 0.89 05/06/2015 1536      Component Value Date/Time   CALCIUM 9.0 05/06/2015 1536   ALKPHOS 71 01/07/2017 0735   AST 19 01/07/2017 0735   ALT 21 01/07/2017 0735   BILITOT 0.8 01/07/2017 0735     Lab Results  Component Value Date   WBC 11.3 (H) 05/06/2015   HGB 15.8 05/06/2015   HCT 46.4 05/06/2015   MCV 92.8 05/06/2015   PLT 221 05/06/2015   IMPRESSION AND PLAN:  1) Localized inflammatory response to wasp stings. Plan: Depo-medrol 40 mg IM given in office today. Instructions: Take 25mg  otc benadryl every 6 hours for itching and swelling. Starting tomorrow, take 3 OTC ibuprofen twice a day WITH FOOD. Elevate and ice hand as much as possible. When swelling has subsided significantly then you can stop these meds.  An After Visit Summary was printed and given to the patient.  FOLLOW UP: Return  if symptoms worsen or fail to improve.  Signed:  Crissie Sickles, MD           03/28/2017

## 2017-03-28 NOTE — Patient Instructions (Signed)
Take 25mg  otc benadryl every 6 hours for itching and swelling. Starting tomorrow, take 3 OTC ibuprofen twice a day WITH FOOD. When swelling has subsided significantly then you can stop these meds.

## 2017-04-19 ENCOUNTER — Other Ambulatory Visit: Payer: Self-pay | Admitting: Cardiovascular Disease

## 2017-04-19 DIAGNOSIS — I2510 Atherosclerotic heart disease of native coronary artery without angina pectoris: Secondary | ICD-10-CM

## 2017-04-19 DIAGNOSIS — E785 Hyperlipidemia, unspecified: Secondary | ICD-10-CM

## 2017-05-27 ENCOUNTER — Telehealth: Payer: Self-pay | Admitting: Family Medicine

## 2017-05-27 ENCOUNTER — Ambulatory Visit (HOSPITAL_BASED_OUTPATIENT_CLINIC_OR_DEPARTMENT_OTHER)
Admission: RE | Admit: 2017-05-27 | Discharge: 2017-05-27 | Disposition: A | Discharge status: Home / Self Care | Payer: BLUE CROSS/BLUE SHIELD | Source: Ambulatory Visit | Attending: Family Medicine | Admitting: Family Medicine

## 2017-05-27 ENCOUNTER — Ambulatory Visit (INDEPENDENT_AMBULATORY_CARE_PROVIDER_SITE_OTHER): Payer: BLUE CROSS/BLUE SHIELD | Admitting: Family Medicine

## 2017-05-27 ENCOUNTER — Other Ambulatory Visit (INDEPENDENT_AMBULATORY_CARE_PROVIDER_SITE_OTHER): Payer: BLUE CROSS/BLUE SHIELD

## 2017-05-27 ENCOUNTER — Encounter: Payer: Self-pay | Admitting: Family Medicine

## 2017-05-27 VITALS — BP 115/74 | HR 72 | Temp 98.2°F | Resp 20 | Wt 188.5 lb

## 2017-05-27 DIAGNOSIS — R509 Fever, unspecified: Secondary | ICD-10-CM | POA: Diagnosis present

## 2017-05-27 DIAGNOSIS — J40 Bronchitis, not specified as acute or chronic: Secondary | ICD-10-CM | POA: Diagnosis present

## 2017-05-27 DIAGNOSIS — R0781 Pleurodynia: Secondary | ICD-10-CM

## 2017-05-27 DIAGNOSIS — R5383 Other fatigue: Secondary | ICD-10-CM | POA: Diagnosis not present

## 2017-05-27 DIAGNOSIS — R05 Cough: Secondary | ICD-10-CM | POA: Insufficient documentation

## 2017-05-27 DIAGNOSIS — R918 Other nonspecific abnormal finding of lung field: Secondary | ICD-10-CM | POA: Diagnosis not present

## 2017-05-27 DIAGNOSIS — R938 Abnormal findings on diagnostic imaging of other specified body structures: Secondary | ICD-10-CM

## 2017-05-27 DIAGNOSIS — R9389 Abnormal findings on diagnostic imaging of other specified body structures: Secondary | ICD-10-CM

## 2017-05-27 MED ORDER — AZITHROMYCIN 250 MG PO TABS: ORAL_TABLET | ORAL | 0 refills | Status: DC

## 2017-05-27 NOTE — Telephone Encounter (Signed)
Spoke with patient he will come back today for labs. Reviewed xray results and information regarding further testing. Patient verbalized understanding.

## 2017-05-27 NOTE — Telephone Encounter (Signed)
Please call pt: His cxr is positive for either pneumonia or a small mass at the very location he is feeling the discomfort in the front of his chest.  - radiology recs are to obtain a CT with contrast to further evaluate and rule out a mass. We will need to collect a kidney function, since contrast is to be used. Both of these should be completed as soon as possible. Orders placed for ASAP (likely tomorrow, so if he can get blood work completed today it would speed things up) - Start z-pack prescribed, rest and hydrate.

## 2017-05-27 NOTE — Progress Notes (Signed)
[name] [name] , 04/28/1951, 66 y.o., male MRN: [id number] Patient Care Team    Relationship Specialty Notifications Start End  Ma Hillock, DO PCP - General Family Medicine  02/14/16     Chief Complaint  Patient presents with  . Headache    body aches,night sweats, x 1 week     Subjective: Pt presents for an OV with complaints of headache, body ache and night sweats of 1 week duration.  Associated symptoms include fatigue, mild cough, thoracic back pain, mid right sternal pain, chills. No sick contacts he is aware of. He reports not being able to complete tasks, getting too fatigued with muscle aches. He denies fever, rash, nausea, vomit or diarrhea. Recent travel. He is eating and drinking well. Pt has tried Advil, pain pills, sleeping pills to ease their symptoms.  Former smoker. No history of lung disease. History of MI 2009.  Depression screen PHQ 2/9 05/27/2017  Decreased Interest 0  Down, Depressed, Hopeless 0  PHQ - 2 Score 0    No Known Allergies Social History  Substance Use Topics  . Smoking status: Former Smoker    Packs/day: 1.00    Years: 40.00    Types: Cigarettes    Quit date: 12/03/2007  . Smokeless tobacco: Never Used  . Alcohol use No   Past Medical History:  Diagnosis Date  . Chicken pox   . Coronary artery disease   . History of shingles   . History of tobacco use   . Hyperlipidemia   . Hypertension   . Myocardial infarction [facility]) 12-08-2007   stent   Past Surgical History:  Procedure Laterality Date  . CATARACT EXTRACTION, BILATERAL  05/2016  . CORONARY STENT PLACEMENT    . KNEE ARTHROSCOPY     bilateral  . VASECTOMY    . WISDOM TOOTH EXTRACTION     Family History  Problem Relation Age of Onset  . Heart disease Father   . Heart disease Paternal Uncle   . Heart disease Mother   . Stroke Mother   . Colon cancer Neg Hx   . Rectal cancer Neg Hx   . Stomach cancer Neg Hx    Allergies as of 05/27/2017   No Known Allergies       Medication List       Accurate as of 05/27/17 10:24 AM. Always use your most recent med list.          aspirin 81 MG tablet Take 81 mg by mouth daily.   carvedilol 3.125 MG tablet Commonly known as:  COREG Take 1 tablet (3.125 mg total) by mouth 2 (two) times daily.   Co Q 10 100 MG Caps Take 1 capsule by mouth daily.   nitroGLYCERIN 0.4 MG SL tablet Commonly known as:  NITROSTAT Place 0.4 mg under the tongue every 5 (five) minutes as needed for chest pain.   simvastatin 20 MG tablet Commonly known as:  ZOCOR Take 1 tablet (20 mg total) by mouth at bedtime.       All past medical history, surgical history, allergies, family history, immunizations andmedications were updated in the EMR today and reviewed under the history and medication portions of their EMR.     ROS: Negative, with the exception of above mentioned in HPI   Objective:  BP 115/74 (BP Location: Left Arm, Patient Position: Sitting, Cuff Size: Large)   Pulse 72   Temp 98.2 F (36.8 C)   Resp 20   Wt 188 lb  8 oz (85.5 kg)   SpO2 97%   BMI 25.57 kg/m  Body mass index is 25.57 kg/m. Gen: Afebrile. No acute distress. Nontoxic in appearance, well developed, well nourished. Caucasian male. HENT: AT. [place]. Bilateral TM visualized within normal limits. MMM, no oral lesions. Bilateral nares mild erythema, mild drainage no swelling. Throat without erythema or exudates. Mild cough, mild hoarseness. Eyes:Pupils Equal Round Reactive to light, Extraocular movements intact,  Conjunctiva without redness, discharge or icterus. Neck/lymp/endocrine: Supple, no lymphadenopathy CV: RRR , No edema Chest: Very mild bilateral crackles lung bases, no wheezing or rhonchi present. Mildly diminished air movement bilaterally. Normal respiratory effort.  MSK: No erythema, full range of motion, no obvious deformities. Skin: No rashes, purpura or petechiae.  Neuro:  Normal gait. PERLA. EOMi. Alert. Oriented x3  No exam data  present No results found. No results found for this or any previous visit (from the past 24 hour(s)).  Assessment/Plan: [name] [name] is a 66 y.o. male present for OV for  Bronchitis - Uncertain definite cause of symptoms, possibly viral etiology. If it had been the flu, it is out of the window to treat with Tamiflu, therefore do not feel the need to test. More concern over lung symptoms and examination, chest x-ray ordered today. - Rest, hydrate, over-the-counter therapy for symptoms such as Advil. - DG Chest 2 View; Future - Z-Pak prescribed to start if chest x-ray is positive, or symptoms do not improve over the next 2 days. - Follow-up dependent upon imaging results.   Reviewed expectations re: course of current medical issues.  Discussed self-management of symptoms.  Outlined signs and symptoms indicating need for more acute intervention.  Patient verbalized understanding and all questions were answered.  Patient received an After-Visit Summary.    No orders of the defined types were placed in this encounter.    Note is dictated utilizing voice recognition software. Although note has been proof read prior to signing, occasional typographical errors still can be missed. If any questions arise, please do not hesitate to call for verification.   electronically signed by:  Howard Pouch, DO  [facility]

## 2017-05-27 NOTE — Patient Instructions (Signed)
Get chest xray today. I want to make certain this is not pneumonia.  I have called in a Z-pack for you to start if it is pneumonia.  If it proves to be viral, there is not much to do except rest, hydrate and advil for discomfort.

## 2017-05-27 NOTE — Addendum Note (Signed)
Addended by: Leota Jacobsen on: 05/27/2017 03:36 PM   Modules accepted: Orders

## 2017-05-28 ENCOUNTER — Telehealth: Payer: Self-pay | Admitting: Family Medicine

## 2017-05-28 ENCOUNTER — Ambulatory Visit (HOSPITAL_BASED_OUTPATIENT_CLINIC_OR_DEPARTMENT_OTHER)
Admission: RE | Admit: 2017-05-28 | Discharge: 2017-05-28 | Disposition: A | Discharge status: Home / Self Care | Payer: BLUE CROSS/BLUE SHIELD | Source: Ambulatory Visit | Attending: Family Medicine | Admitting: Family Medicine

## 2017-05-28 ENCOUNTER — Encounter (HOSPITAL_BASED_OUTPATIENT_CLINIC_OR_DEPARTMENT_OTHER): Payer: Self-pay

## 2017-05-28 DIAGNOSIS — R938 Abnormal findings on diagnostic imaging of other specified body structures: Secondary | ICD-10-CM | POA: Diagnosis present

## 2017-05-28 DIAGNOSIS — R9389 Abnormal findings on diagnostic imaging of other specified body structures: Secondary | ICD-10-CM

## 2017-05-28 DIAGNOSIS — I700 Atherosclerosis of aorta: Secondary | ICD-10-CM | POA: Insufficient documentation

## 2017-05-28 DIAGNOSIS — R918 Other nonspecific abnormal finding of lung field: Secondary | ICD-10-CM | POA: Diagnosis not present

## 2017-05-28 DIAGNOSIS — J439 Emphysema, unspecified: Secondary | ICD-10-CM | POA: Insufficient documentation

## 2017-05-28 DIAGNOSIS — J432 Centrilobular emphysema: Secondary | ICD-10-CM | POA: Insufficient documentation

## 2017-05-28 DIAGNOSIS — I2510 Atherosclerotic heart disease of native coronary artery without angina pectoris: Secondary | ICD-10-CM | POA: Insufficient documentation

## 2017-05-28 DIAGNOSIS — R0781 Pleurodynia: Secondary | ICD-10-CM | POA: Diagnosis not present

## 2017-05-28 DIAGNOSIS — J189 Pneumonia, unspecified organism: Secondary | ICD-10-CM

## 2017-05-28 DIAGNOSIS — T8182XA Emphysema (subcutaneous) resulting from a procedure, initial encounter: Secondary | ICD-10-CM

## 2017-05-28 LAB — CBC WITH DIFFERENTIAL/PLATELET
BASOS ABS: 51 {cells}/uL (ref 0–200)
Basophils Relative: 0.6 %
EOS ABS: 289 {cells}/uL (ref 15–500)
Eosinophils Relative: 3.4 %
HCT: 41.5 % (ref 38.5–50.0)
HEMOGLOBIN: 14.2 g/dL (ref 13.2–17.1)
LYMPHS ABS: 1539 {cells}/uL (ref 850–3900)
MCH: 31.6 pg (ref 27.0–33.0)
MCHC: 34.2 g/dL (ref 32.0–36.0)
MCV: 92.2 fL (ref 80.0–100.0)
MPV: 10.4 fL (ref 7.5–12.5)
Monocytes Relative: 12.7 %
NEUTROS ABS: 5542 {cells}/uL (ref 1500–7800)
Neutrophils Relative %: 65.2 %
Platelets: 223 10*3/uL (ref 140–400)
RBC: 4.5 10*6/uL (ref 4.20–5.80)
RDW: 11.9 % (ref 11.0–15.0)
Total Lymphocyte: 18.1 %
WBC mixed population: 1080 cells/uL — ABNORMAL HIGH (ref 200–950)
WBC: 8.5 10*3/uL (ref 3.8–10.8)

## 2017-05-28 LAB — BASIC METABOLIC PANEL
BUN: 12 mg/dL (ref 7–25)
CO2: 27 mmol/L (ref 20–32)
Calcium: 8.5 mg/dL — ABNORMAL LOW (ref 8.6–10.3)
Chloride: 103 mmol/L (ref 98–110)
Creat: 0.85 mg/dL (ref 0.70–1.25)
GLUCOSE: 92 mg/dL (ref 65–99)
Potassium: 4.7 mmol/L (ref 3.5–5.3)
SODIUM: 140 mmol/L (ref 135–146)

## 2017-05-28 MED ORDER — AMOXICILLIN-POT CLAVULANATE 875-125 MG PO TABS: 1.0000 | ORAL_TABLET | Freq: Two times a day (BID) | ORAL | 0 refills | Status: DC

## 2017-05-28 MED ORDER — IOPAMIDOL (ISOVUE-300) INJECTION 61%
100.0000 mL | Freq: Once | INTRAVENOUS | Status: AC | PRN
  Administered 2017-05-28: 80 mL via INTRAVENOUS

## 2017-05-28 NOTE — Telephone Encounter (Signed)
Discussed labs (WBC normal) and CT  Results (below) with pt today. He is feeling ok today, still fatigued and endorsed night sweats last night. Will add Augmentin BID x 14 days in addition to his azithromycin.  Referral to pulm to follow given potential need for longer term follow up given possible blebs, possible cavitation and the presence of newly found emphysema.  Pt was instructed if he continues to have fever after start of dual therapy after 24 hours, or his symptoms are worsening, he is to be seen in ED immediatly as IV abx very well may be needed. He endorsed understanding.   IMPRESSION: 1. The opacity questioned on chest x-ray represents opacification within the superior segment of the right lower lobe and inferior aspect of the right upper lobe most consistent with pneumonia. Although this pneumonia may involve blebs in this patient with severe centrilobular and paraseptal emphysema, cavitation within this pneumonia cannot be excluded. 2. Moderate thoracic aortic atherosclerosis. 3. Diffuse coronary artery calcifications. 4. Slight prominence of pulmonary arteries suggestive of a degree pulmonary arterial hypertension.

## 2017-05-29 ENCOUNTER — Encounter: Payer: Self-pay | Admitting: Family Medicine

## 2017-06-03 ENCOUNTER — Encounter: Payer: Self-pay | Admitting: Pulmonary Disease

## 2017-06-03 ENCOUNTER — Ambulatory Visit (INDEPENDENT_AMBULATORY_CARE_PROVIDER_SITE_OTHER): Payer: BLUE CROSS/BLUE SHIELD | Admitting: Pulmonary Disease

## 2017-06-03 VITALS — BP 120/64 | HR 64 | Ht 72.0 in | Wt 186.4 lb

## 2017-06-03 DIAGNOSIS — J181 Lobar pneumonia, unspecified organism: Secondary | ICD-10-CM

## 2017-06-03 DIAGNOSIS — J438 Other emphysema: Secondary | ICD-10-CM

## 2017-06-03 DIAGNOSIS — J189 Pneumonia, unspecified organism: Secondary | ICD-10-CM | POA: Insufficient documentation

## 2017-06-03 NOTE — Assessment & Plan Note (Signed)
Infiltrates are new compared to recent chest x-ray and clearly accompanied by symptoms cystoscopy, headache or pneumonia. He seems to have responded well to antibiotics and is close to his baseline again. Will need follow-up chest x-ray to resolution we'll obtain an one month

## 2017-06-03 NOTE — Progress Notes (Signed)
Subjective:    Patient ID: [name] [name], male    DOB: 11-Aug-1951, 66 y.o.   MRN: [id number]  HPI  Chief Complaint  Patient presents with  . Pulm Consult    Referred by Dr. Raoul Pitch for pneumonia. Pt denied any breathing issues prior to PNA.  States has occassional SOB since then.    66 year old heavy ex-smoker presents for evaluation of pneumonia. He smoked about 2 packs per day starting as a teenager until he quit in 2009 after he had his heart attack, more than 60 pack years. 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 He can ride a bike for long distances and in fact writes to work. He reports flulike symptoms for about a week followed by night sweats, then developed right infrascapular chest pain and right breast pleuritic pain with a dry cough. Chest x-ray showed hyperinflation and new right hilar and suprahilar opacity compared to a recent chest x-ray from 01/2017. CT chest with contrast was then obtained which showed opacification within this appears segment of the right lower lobe and inferior part of right upper lobe consistent with pneumonic consolidation. There is extensive centrilobular and paraseptal emphysema and slight prominent pulmonary arteries.  He was initially given Z-Pak and then Augmentin which he still taking. He states that symptoms have resolved. He denies wheezing or frequent chest colds He denies weight loss loss of appetite or sick contacts    Past Medical History:  Diagnosis Date  . Chicken pox   . Coronary artery disease   . History of shingles   . History of tobacco use   . Hyperlipidemia   . Hypertension   . Myocardial infarction [facility]) 12-08-2007   stent   Past Surgical History:  Procedure Laterality Date  . CATARACT EXTRACTION, BILATERAL  05/2016  . CORONARY STENT PLACEMENT    . KNEE ARTHROSCOPY     bilateral  . VASECTOMY    .  WISDOM TOOTH EXTRACTION      No Known Allergies  Social History   Social History  . Marital status: Married    Spouse name: N/A  . Number of children: N/A  . Years of education: 13   Occupational History  . [employer]   Social History Main Topics  . Smoking status: Former Smoker    Packs/day: 1.00    Years: 40.00    Types: Cigarettes    Quit date: 12/03/2007  . Smokeless tobacco: Never Used  . Alcohol use No  . Drug use: No  . Sexual activity: Not on file   Other Topics Concern  . Not on file   Social History Narrative   [name] lives with his wife and her 3 children. He works at [employer] & frequently lifts heavy objects.    Family History  Problem Relation Age of Onset  . Heart disease Father   . Heart disease Paternal Uncle   . Heart disease Mother   . Stroke Mother   . Colon cancer Neg Hx   . Rectal cancer Neg Hx   . Stomach cancer Neg Hx      Review of Systems  Constitutional: Negative for fever and unexpected weight change.  HENT: Negative for congestion, dental problem, ear pain, nosebleeds, postnasal drip, rhinorrhea, sinus pressure, sneezing, sore throat and trouble swallowing.   Eyes: Negative for  redness and itching.  Respiratory: Positive for cough and chest tightness. Negative for shortness of breath and wheezing.   Cardiovascular: Negative for palpitations and leg swelling.  Gastrointestinal: Negative for nausea and vomiting.  Genitourinary: Negative for dysuria.  Musculoskeletal: Negative for joint swelling.  Skin: Negative for rash.  Allergic/Immunologic: Negative.  Negative for environmental allergies, food allergies and immunocompromised state.  Neurological: Negative for headaches.  Hematological: Does not bruise/bleed easily.  Psychiatric/Behavioral: Negative for dysphoric mood. The patient is not nervous/anxious.        Objective:   Physical Exam  Gen. Pleasant, tall,well-nourished, in no  distress, normal affect ENT - no lesions, no post nasal drip Neck: No JVD, no thyromegaly, no carotid bruits Lungs: no use of accessory muscles, no dullness to percussion, decreased BL  without rales or rhonchi  Cardiovascular: Rhythm regular, heart sounds  normal, no murmurs or gallops, no peripheral edema Abdomen: soft and non-tender, no hepatosplenomegaly, BS normal. Musculoskeletal: No deformities, no cyanosis or clubbing Neuro:  alert, non focal       Assessment & Plan:

## 2017-06-03 NOTE — Assessment & Plan Note (Signed)
Imaging confirms hyperinflation and changes of centrilobular emphysema We will obtain PFTs in about one month once his pneumonia is completely resolved to quantify Regardless is relatively symptomatic and does not need bronchodilators of present Immunization was emphasized and we will vaccinate him at his next visit against flu and pneumonia

## 2017-06-03 NOTE — Patient Instructions (Signed)
Chest x-ray in 4 weeks on follow-up visit. Schedule PFTs same day

## 2017-07-02 ENCOUNTER — Ambulatory Visit (INDEPENDENT_AMBULATORY_CARE_PROVIDER_SITE_OTHER): Payer: BLUE CROSS/BLUE SHIELD | Admitting: Pulmonary Disease

## 2017-07-02 ENCOUNTER — Encounter: Payer: Self-pay | Admitting: Pulmonary Disease

## 2017-07-02 ENCOUNTER — Ambulatory Visit (INDEPENDENT_AMBULATORY_CARE_PROVIDER_SITE_OTHER)
Admission: RE | Admit: 2017-07-02 | Discharge: 2017-07-02 | Disposition: A | Discharge status: Home / Self Care | Payer: BLUE CROSS/BLUE SHIELD | Source: Ambulatory Visit | Attending: Pulmonary Disease | Admitting: Pulmonary Disease

## 2017-07-02 VITALS — BP 116/72 | HR 62 | Ht 72.0 in | Wt 190.0 lb

## 2017-07-02 DIAGNOSIS — J181 Lobar pneumonia, unspecified organism: Secondary | ICD-10-CM | POA: Diagnosis not present

## 2017-07-02 DIAGNOSIS — J438 Other emphysema: Secondary | ICD-10-CM

## 2017-07-02 DIAGNOSIS — J439 Emphysema, unspecified: Secondary | ICD-10-CM

## 2017-07-02 DIAGNOSIS — Z23 Encounter for immunization: Secondary | ICD-10-CM

## 2017-07-02 DIAGNOSIS — J189 Pneumonia, unspecified organism: Secondary | ICD-10-CM

## 2017-07-02 LAB — PULMONARY FUNCTION TEST
DL/VA % PRED: 45 %
DL/VA: 2.16 ml/min/mmHg/L
DLCO UNC % PRED: 48 %
DLCO cor % pred: 47 %
DLCO cor: 16.62 ml/min/mmHg
DLCO unc: 17.07 ml/min/mmHg
FEF 25-75 PRE: 1.54 L/s
FEF 25-75 Post: 1.27 L/sec
FEF2575-%CHANGE-POST: -17 %
FEF2575-%Pred-Post: 44 %
FEF2575-%Pred-Pre: 54 %
FEV1-%CHANGE-POST: -6 %
FEV1-%PRED-PRE: 85 %
FEV1-%Pred-Post: 79 %
FEV1-PRE: 3.1 L
FEV1-Post: 2.9 L
FEV1FVC-%CHANGE-POST: -2 %
FEV1FVC-%Pred-Pre: 84 %
FEV6-%CHANGE-POST: -3 %
FEV6-%PRED-POST: 101 %
FEV6-%PRED-PRE: 105 %
FEV6-PRE: 4.92 L
FEV6-Post: 4.73 L
FEV6FVC-%CHANGE-POST: 0 %
FEV6FVC-%PRED-PRE: 104 %
FEV6FVC-%Pred-Post: 104 %
FVC-%CHANGE-POST: -3 %
FVC-%Pred-Post: 97 %
FVC-%Pred-Pre: 101 %
FVC-Post: 4.79 L
FVC-Pre: 4.97 L
POST FEV6/FVC RATIO: 99 %
PRE FEV6/FVC RATIO: 99 %
Post FEV1/FVC ratio: 61 %
Pre FEV1/FVC ratio: 62 %
RV % pred: 115 %
RV: 2.87 L
TLC % pred: 112 %
TLC: 8.32 L

## 2017-07-02 NOTE — Addendum Note (Signed)
Addended by: Valerie Salts on: 07/02/2017 11:43 AM   Modules accepted: Orders

## 2017-07-02 NOTE — Assessment & Plan Note (Signed)
Mild airway obstruction on spirometry.  Feel a DLCO of 48% is more consistent with his CAT scan showing extensive emphysema Does not need maintenance meds at this time. We discussed immunizations and he was amenable to taking the flu shot and Prevnar today  He will follow-up with [place] on an as-needed basis

## 2017-07-02 NOTE — Progress Notes (Signed)
PFT done today by Krrish Freund, CMA  

## 2017-07-02 NOTE — Patient Instructions (Addendum)
FLu shot Prevnar Pneumonia has resolved Breathing test shows mild emphysema

## 2017-07-02 NOTE — Progress Notes (Signed)
   Subjective:    Patient ID: [name] [name], male    DOB: 19-Apr-1951, 66 y.o.   MRN: [id number]  HPI  66 year old heavy ex-smoker  for FU of pneumonia & COPD. He smoked about 2 packs per day starting as a teenager until he quit in 2009 after he had his heart attack, more than 60 pack years.  He reports flulike symptoms for about a week followed by night sweats, then developed right infrascapular chest pain and right breast pleuritic pain with a dry cough. Chest x-ray showed hyperinflation and new right hilar and suprahilar opacity compared to a recent chest x-ray from 01/2017. CT chest with contrast 05/28/17 >> opacification sup segment of the right lower lobe and inferior part of right upper lobe consistent with pneumonic consolidation. extensive centrilobular and paraseptal emphysema and slight prominent pulmonary arteries.  He feels much improved and is back to baseline no fevers or cough.  Chest x-ray shows resolution of infiltrate,.  PFTs 06/2017 show ratio 62, FEV1 of 85%, FVC of 101% suggesting mild airway obstruction, more the smaller airways mild hyperinflation, and DLCO 48% which is more consistent with a CT scan  Review of Systems Patient denies significant dyspnea,cough, hemoptysis,  chest pain, palpitations, pedal edema, orthopnea, paroxysmal nocturnal dyspnea, lightheadedness, nausea, vomiting, abdominal or  leg pains      Objective:   Physical Exam  Gen. Pleasant, well-nourished, in no distress ENT - no thrush, no post nasal drip Neck: No JVD, no thyromegaly, no carotid bruits Lungs: no use of accessory muscles, no dullness to percussion, clear without rales or rhonchi  Cardiovascular: Rhythm regular, heart sounds  normal, no murmurs or gallops, no peripheral edema Musculoskeletal: No deformities, no cyanosis or clubbing        Assessment & Plan:

## 2017-07-02 NOTE — Assessment & Plan Note (Signed)
Resolved

## 2017-07-24 ENCOUNTER — Other Ambulatory Visit: Payer: Self-pay | Admitting: Family Medicine

## 2017-10-07 ENCOUNTER — Ambulatory Visit (INDEPENDENT_AMBULATORY_CARE_PROVIDER_SITE_OTHER): Payer: Medicare Other | Admitting: Family Medicine

## 2017-10-07 ENCOUNTER — Encounter: Payer: Self-pay | Admitting: Family Medicine

## 2017-10-07 DIAGNOSIS — B029 Zoster without complications: Secondary | ICD-10-CM

## 2017-10-07 MED ORDER — VALACYCLOVIR HCL 1 G PO TABS: 1000.0000 mg | ORAL_TABLET | Freq: Three times a day (TID) | ORAL | 0 refills | Status: DC

## 2017-10-07 NOTE — Patient Instructions (Signed)

## 2017-10-07 NOTE — Progress Notes (Signed)
[name] [name] , March 30, 1951, 67 y.o., male MRN: [id number] Patient Care Team    Relationship Specialty Notifications Start End  [name] Hillock, DO PCP - General Family Medicine  02/14/16     Chief Complaint  Patient presents with  . Herpes Zoster    pt concerns about his shingles coming back     Subjective: Pt presents for an OV with complaints of shingles of 2 days duration.  Associated symptoms include right mid back "itching" in the same area as prior shingles outbreak a few years ago. He reports the first episode was bad and wrapped around from his back  To chest. He denies any fever, chills, headache or fatigue.    Depression screen PHQ 2/9 05/27/2017  Decreased Interest 0  Down, Depressed, Hopeless 0  PHQ - 2 Score 0    No Known Allergies Social History   Tobacco Use  . Smoking status: Former Smoker    Packs/day: 1.00    Years: 40.00    Pack years: 40.00    Types: Cigarettes    Last attempt to quit: 12/03/2007    Years since quitting: 9.8  . Smokeless tobacco: Never Used  Substance Use Topics  . Alcohol use: No   Past Medical History:  Diagnosis Date  . Chicken pox   . Colon polyps   . Coronary artery disease   . Diverticulosis   . History of shingles   . History of tobacco use   . Hyperlipidemia   . Hypertension   . Internal hemorrhoid   . Myocardial infarction [name] [name]) 12-08-2007   stent   Past Surgical History:  Procedure Laterality Date  . CATARACT EXTRACTION, BILATERAL  05/2016  . CORONARY STENT PLACEMENT    . KNEE ARTHROSCOPY     bilateral  . VASECTOMY    . WISDOM TOOTH EXTRACTION     Family History  Problem Relation Age of Onset  . Heart disease Father   . Heart disease Paternal Uncle   . Heart disease Mother   . Stroke Mother   . Colon cancer Neg Hx   . Rectal cancer Neg Hx   . Stomach cancer Neg Hx    Allergies as of 10/07/2017   No Known Allergies     Medication List        Accurate as of 10/07/17  4:07 PM. Always use your most  recent med list.          aspirin 81 MG tablet Take 81 mg by mouth daily.   carvedilol 3.125 MG tablet Commonly known as:  COREG Take 1 tablet (3.125 mg total) by mouth 2 (two) times daily.   Co Q 10 100 MG Caps Take 1 capsule by mouth daily.   nitroGLYCERIN 0.4 MG SL tablet Commonly known as:  NITROSTAT Place 0.4 mg under the tongue every 5 (five) minutes as needed for chest pain.   simvastatin 20 MG tablet Commonly known as:  ZOCOR Take 1 tablet (20 mg total) by mouth at bedtime.   valACYclovir 1000 MG tablet Commonly known as:  VALTREX Take 1 tablet (1,000 mg total) by mouth 3 (three) times daily.       All past medical history, surgical history, allergies, family history, immunizations andmedications were updated in the EMR today and reviewed under the history and medication portions of their EMR.     ROS: Negative, with the exception of above mentioned in HPI   Objective:  BP 124/80 (BP Location: Right Arm, Patient Position:  Sitting, Cuff Size: Normal)   Pulse 83   Temp 97.9 F (36.6 C) (Oral)   Ht 6' (1.829 m)   Wt 192 lb 12.8 oz (87.5 kg)   SpO2 94%   BMI 26.15 kg/m  Body mass index is 26.15 kg/m. Gen: Afebrile. No acute distress. Nontoxic in appearance, well developed, well nourished.  HENT: AT. [name] [name]. MMM Skin: 4-5 excoriated and mildly raised vesicles/red base right mid thoracic. No other  rashes, purpura or petechiae.  Neuro: Normal gait. PERLA. EOMi. Alert. Oriented x3  No exam data present No results found. No results found for this or any previous visit (from the past 24 hour(s)).  Assessment/Plan: [name] [name] is a 67 y.o. male present for OV for  1. Shingles - exam consistent with early shingles outbreak. Valtrex prescribed.  - encouraged OTC Lidoderm patches or creams for comfort.  - encouraged shingrix vaccine consideration. He will check with pharmacy for prices.  - valACYclovir (VALTREX) 1000 MG tablet; Take 1 tablet (1,000 mg total) by  mouth 3 (three) times daily.  Dispense: 21 tablet; Refill: 0 - F/U PRN   Reviewed expectations re: course of current medical issues.  Discussed self-management of symptoms.  Outlined signs and symptoms indicating need for more acute intervention.  Patient verbalized understanding and all questions were answered.  Patient received an After-Visit Summary.    No orders of the defined types were placed in this encounter.    Note is dictated utilizing voice recognition software. Although note has been proof read prior to signing, occasional typographical errors still can be missed. If any questions arise, please do not hesitate to call for verification.   electronically signed by:  [name] Pouch, DO  [place]

## 2018-01-30 ENCOUNTER — Other Ambulatory Visit: Payer: Self-pay | Admitting: Cardiovascular Disease

## 2018-01-30 DIAGNOSIS — I2510 Atherosclerotic heart disease of native coronary artery without angina pectoris: Secondary | ICD-10-CM

## 2018-01-30 DIAGNOSIS — E785 Hyperlipidemia, unspecified: Secondary | ICD-10-CM

## 2018-02-10 ENCOUNTER — Other Ambulatory Visit: Payer: Self-pay | Admitting: Cardiovascular Disease

## 2018-02-10 DIAGNOSIS — I2510 Atherosclerotic heart disease of native coronary artery without angina pectoris: Secondary | ICD-10-CM

## 2018-02-10 DIAGNOSIS — E785 Hyperlipidemia, unspecified: Secondary | ICD-10-CM

## 2018-02-10 MED ORDER — CARVEDILOL 3.125 MG PO TABS: 3.1250 mg | ORAL_TABLET | Freq: Two times a day (BID) | ORAL | 0 refills | Status: DC

## 2018-02-10 NOTE — Telephone Encounter (Signed)
Pt's medication was sent to pt's pharmacy as requested. Confirmation received.  °

## 2018-03-31 ENCOUNTER — Ambulatory Visit: Payer: Medicare Other | Admitting: Cardiovascular Disease

## 2018-03-31 ENCOUNTER — Encounter: Payer: Self-pay | Admitting: Cardiovascular Disease

## 2018-03-31 VITALS — BP 128/86 | HR 56 | Ht 72.0 in | Wt 196.4 lb

## 2018-03-31 DIAGNOSIS — I2510 Atherosclerotic heart disease of native coronary artery without angina pectoris: Secondary | ICD-10-CM

## 2018-03-31 DIAGNOSIS — E782 Mixed hyperlipidemia: Secondary | ICD-10-CM | POA: Diagnosis not present

## 2018-03-31 LAB — HEPATIC FUNCTION PANEL
ALBUMIN: 4.4 g/dL (ref 3.6–4.8)
ALK PHOS: 82 IU/L (ref 39–117)
ALT: 18 IU/L (ref 0–44)
AST: 18 IU/L (ref 0–40)
Bilirubin Total: 0.5 mg/dL (ref 0.0–1.2)
Bilirubin, Direct: 0.14 mg/dL (ref 0.00–0.40)
TOTAL PROTEIN: 6.8 g/dL (ref 6.0–8.5)

## 2018-03-31 LAB — LIPID PANEL
CHOL/HDL RATIO: 3.2 ratio (ref 0.0–5.0)
Cholesterol, Total: 194 mg/dL (ref 100–199)
HDL: 61 mg/dL (ref >39)
LDL CALC: 115 mg/dL — AB (ref 0–99)
TRIGLYCERIDES: 91 mg/dL (ref 0–149)
VLDL CHOLESTEROL CAL: 18 mg/dL (ref 5–40)

## 2018-03-31 NOTE — Patient Instructions (Signed)
Medication Instructions:  Your physician recommends that you continue on your current medications as directed. Please refer to the Current Medication list given to you today.   Labwork: Today: Lipid panel and Hepatic panel   Testing/Procedures: None  Follow-Up: Your physician wants you to follow-up in: 1 year with Dr. Burt Knack. You will receive a reminder letter in the mail two months in advance. If you don't receive a letter, please call our office to schedule the follow-up appointment.   If you need a refill on your cardiac medications before your next appointment, please call your pharmacy.

## 2018-03-31 NOTE — Progress Notes (Signed)
Cardiology Office Note Date:  03/31/2018   ID:  [name] [name], DOB 07/15/1951, MRN [id number]  PCP:  Ma Hillock, DO  Cardiologist:  Sherren Mocha, MD    Chief Complaint  Patient presents with  . Follow-up    CAD   History of Present Illness: [name] [name] is a 67 y.o. male who presents for follow-up of CAD, last seen here in May 2018. He presented with an anterior MI in 2009, treated with primary PCI using a bare metal stent in the LAD. LV systolic function was preserved at the time of his infarct. He's had no recurrent angina or ischemic events since his initial presentation. He was diagnosed with radiographic evidence of emphysema since the time of his last visit. He has an extensive smoking history but quit at the time of his MI in 2009.   The patient is here alone today.  He has been doing well.  He did have pneumonia last year he has established with pulmonary medicine.  He was incidentally diagnosed with emphysema based on CT scan results.  He has very mild functional impairment.  When he rides his bike up a hill he has some shortness of breath at the onset of exercise.  This resolves with continued bike riding and he is able to ride 10 miles without limitation.  Denies chest pain or pressure, leg swelling, orthopnea, or PND.  He is gained about 10 pounds and relates this to dietary indiscretion and not being consistent with his exercise.  Past Medical History:  Diagnosis Date  . Chicken pox   . Colon polyps   . Coronary artery disease   . Diverticulosis   . History of shingles   . History of tobacco use   . Hyperlipidemia   . Hypertension   . Internal hemorrhoid   . Myocardial infarction [facility]) 12-08-2007   stent    Past Surgical History:  Procedure Laterality Date  . CATARACT EXTRACTION, BILATERAL  05/2016  . CORONARY STENT PLACEMENT    . KNEE ARTHROSCOPY     bilateral  . VASECTOMY    . WISDOM TOOTH EXTRACTION      Current Outpatient Medications    Medication Sig Dispense Refill  . aspirin 81 MG tablet Take 81 mg by mouth daily.      . carvedilol (COREG) 3.125 MG tablet Take 1 tablet (3.125 mg total) by mouth 2 (two) times daily. Please keep upcoming appt in July for future refills. Thank you 180 tablet 0  . Coenzyme Q10 (CO Q 10) 100 MG CAPS Take 1 capsule by mouth daily.    . nitroGLYCERIN (NITROSTAT) 0.4 MG SL tablet Place 0.4 mg under the tongue every 5 (five) minutes as needed for chest pain.     . simvastatin (ZOCOR) 20 MG tablet TAKE ONE TABLET BY MOUTH AT BEDTIME 90 tablet 0  . valACYclovir (VALTREX) 1000 MG tablet Take 1 tablet (1,000 mg total) by mouth 3 (three) times daily. (Patient not taking: Reported on 03/31/2018) 21 tablet 0   No current facility-administered medications for this visit.     Allergies:   Patient has no known allergies.   Social History:  The patient  reports that he quit smoking about 10 years ago. His smoking use included cigarettes. He has a 40.00 pack-year smoking history. He has never used smokeless tobacco. He reports that he does not drink alcohol or use drugs.   Family History:  The patient's family history includes Heart disease in his  father, mother, and paternal uncle; Stroke in his mother.   ROS:  Please see the history of present illness.  All other systems are reviewed and negative.   PHYSICAL EXAM: VS:  BP 128/86   Pulse (!) 56   Ht 6' (1.829 m)   Wt 196 lb 6.4 oz (89.1 kg)   SpO2 94%   BMI 26.64 kg/m  , BMI Body mass index is 26.64 kg/m. GEN: Well nourished, well developed, in no acute distress  HEENT: normal  Neck: no JVD, no masses. No carotid bruits Cardiac: RRR without murmur or gallop      Respiratory:  clear to auscultation bilaterally, normal work of breathing GI: soft, nontender, nondistended, + BS MS: no deformity or atrophy  Ext: no pretibial edema, pedal pulses 2+= bilaterally, varicosities noted bilaterally Skin: warm and dry, no rash Neuro:  Strength and sensation  are intact Psych: euthymic mood, full affect  EKG:  EKG is ordered today. The ekg ordered today shows sinus bradycardia 56 bpm, otherwise normal  Recent Labs: 05/27/2017: BUN 12; Creat 0.85; Hemoglobin 14.2; Platelets 223; Potassium 4.7; Sodium 140   Lipid Panel     Component Value Date/Time   CHOL 158 01/07/2017 0735   TRIG 113 01/07/2017 0735   HDL 54 01/07/2017 0735   CHOLHDL 2.9 01/07/2017 0735   CHOLHDL 3.1 02/13/2016 0733   VLDL 19 02/13/2016 0733   LDLCALC 81 01/07/2017 0735      Wt Readings from Last 3 Encounters:  03/31/18 196 lb 6.4 oz (89.1 kg)  10/07/17 192 lb 12.8 oz (87.5 kg)  07/02/17 190 lb (86.2 kg)     ASSESSMENT AND PLAN: 1.  CAD, native vessel, without angina: The patient is doing well on low-dose carvedilol, aspirin, a statin drug.  Unable to titrate carvedilol because of bradycardia.  He has no anginal symptoms at a good activity level.  He will follow-up in 1 year.  2. Hyperlipidemia: Last lipids reviewed.  He is treated with simvastatin.  He is fasting today for lipids and LFTs.  We discussed lifestyle modification at length.  His weights are trended and he is up about 10 pounds.  He will work on portion control and weight loss measures.   Current medicines are reviewed with the patient today.  The patient does not have concerns regarding medicines.  Labs/ tests ordered today include:   Orders Placed This Encounter  Procedures  . Lipid Profile  . Hepatic function panel  . EKG 12-Lead    Disposition:   FU one year  Signed, Sherren Mocha, MD  03/31/2018 9:08 AM    [facility] [address], [place], [place]  [address] Phone: [phone]; Fax: [phone]

## 2018-04-02 ENCOUNTER — Telehealth: Payer: Self-pay

## 2018-04-02 DIAGNOSIS — I700 Atherosclerosis of aorta: Secondary | ICD-10-CM

## 2018-04-02 MED ORDER — ROSUVASTATIN CALCIUM 20 MG PO TABS: 20.0000 mg | ORAL_TABLET | Freq: Every day | ORAL | 3 refills | Status: DC

## 2018-04-02 NOTE — Telephone Encounter (Signed)
-----   Message from Sherren Mocha, MD sent at 04/01/2018  1:57 PM EDT ----- LDL is too high for someone with CAD. He will work on lifestyle modification, but would favor a more potent statin such as rosuvastatin 20 mg daily with lipids/lft's in 12 weeks. thx

## 2018-04-02 NOTE — Telephone Encounter (Signed)
Informed patient of results and verbal understanding expressed.  Instructed patient to STOP SIMVASTATIN and START CRESTOR 20 mg daily. FLP and LFTs scheduled 11/1.  Patient agrees with treatment plan.

## 2018-05-09 ENCOUNTER — Encounter: Payer: Self-pay | Admitting: Family Medicine

## 2018-05-09 ENCOUNTER — Ambulatory Visit (INDEPENDENT_AMBULATORY_CARE_PROVIDER_SITE_OTHER): Payer: Medicare Other | Admitting: Family Medicine

## 2018-05-09 VITALS — BP 121/80 | HR 61 | Temp 97.5°F | Resp 20 | Ht 72.0 in | Wt 190.0 lb

## 2018-05-09 DIAGNOSIS — I809 Phlebitis and thrombophlebitis of unspecified site: Secondary | ICD-10-CM

## 2018-05-09 DIAGNOSIS — M79605 Pain in left leg: Secondary | ICD-10-CM

## 2018-05-09 DIAGNOSIS — Z23 Encounter for immunization: Secondary | ICD-10-CM

## 2018-05-09 MED ORDER — ZOSTER VAC RECOMB ADJUVANTED 50 MCG/0.5ML IM SUSR: 0.5000 mL | Freq: Once | INTRAMUSCULAR | 1 refills | Status: AC

## 2018-05-09 NOTE — Progress Notes (Signed)
[name] [name] , 10/03/1950, 67 y.o., male MRN: [id number] Patient Care Team    Relationship Specialty Notifications Start End  Ma Hillock, DO PCP - General Family Medicine  02/14/16   Sherren Mocha, MD PCP - Cardiology Cardiology Admissions 03/25/18     Chief Complaint  Patient presents with  . Leg Pain    x 1 week     Subjective: Pt presents for an OV with complaints of leg pain  of 1 week duration.  Associated symptoms include mild redness and intermittent throbbing. He states he has been getting charlie horses in his calf, and that is not normal for him. Pt has tried nothing to ease their symptoms. He takes a daily baby ASA. He denies recent travel or injury. He reports a fhx of stroke and he is worried he has a "DVT". Patient denies chest pain, palpitations,  shortness of breath, dizziness or lower extremity edema.   Depression screen [facility] 2/9 05/09/2018 05/27/2017  Decreased Interest 0 0  Down, Depressed, Hopeless 0 0  PHQ - 2 Score 0 0    No Known Allergies Social History   Tobacco Use  . Smoking status: Former Smoker    Packs/day: 1.00    Years: 40.00    Pack years: 40.00    Types: Cigarettes    Last attempt to quit: 12/03/2007    Years since quitting: 10.4  . Smokeless tobacco: Never Used  Substance Use Topics  . Alcohol use: No   Past Medical History:  Diagnosis Date  . Chicken pox   . Colon polyps   . Coronary artery disease   . Diverticulosis   . History of shingles   . History of tobacco use   . Hyperlipidemia   . Hypertension   . Internal hemorrhoid   . Myocardial infarction [facility]) 12-08-2007   stent   Past Surgical History:  Procedure Laterality Date  . CATARACT EXTRACTION, BILATERAL  05/2016  . CORONARY STENT PLACEMENT    . KNEE ARTHROSCOPY     bilateral  . VASECTOMY    . WISDOM TOOTH EXTRACTION     Family History  Problem Relation Age of Onset  . Heart disease Father   . Heart disease Paternal Uncle   . Heart disease Mother   .  Stroke Mother   . Colon cancer Neg Hx   . Rectal cancer Neg Hx   . Stomach cancer Neg Hx    Allergies as of 05/09/2018   No Known Allergies     Medication List        Accurate as of 05/09/18  1:38 PM. Always use your most recent med list.          aspirin 81 MG tablet Take 81 mg by mouth daily.   carvedilol 3.125 MG tablet Commonly known as:  COREG Take 1 tablet (3.125 mg total) by mouth 2 (two) times daily. Please keep upcoming appt in July for future refills. Thank you   Co Q 10 100 MG Caps Take 1 capsule by mouth daily.   nitroGLYCERIN 0.4 MG SL tablet Commonly known as:  NITROSTAT Place 0.4 mg under the tongue every 5 (five) minutes as needed for chest pain.   rosuvastatin 20 MG tablet Commonly known as:  CRESTOR Take 1 tablet (20 mg total) by mouth daily.       All past medical history, surgical history, allergies, family history, immunizations andmedications were updated in the EMR today and reviewed under the history and  medication portions of their EMR.     ROS: Negative, with the exception of above mentioned in HPI   Objective:  BP 121/80 (BP Location: Right Arm, Patient Position: Sitting, Cuff Size: Normal)   Pulse 61   Temp (!) 97.5 F (36.4 C)   Resp 20   Ht 6' (1.829 m)   Wt 190 lb (86.2 kg)   SpO2 97%   BMI 25.77 kg/m  Body mass index is 25.77 kg/m. Gen: Afebrile. No acute distress. Nontoxic in appearance, well developed, well nourished.  HENT: AT. [place].  MMM Eyes:Pupils Equal Round Reactive to light, Extraocular movements intact,  Conjunctiva without redness, discharge or icterus. CV: RRR no murmur, no edema Chest: CTAB, no wheeze or crackles. Good air movement, normal resp effort.  Skin/LLE: No rashes, purpura or petechiae. Varicose veins present. Mild TTP left lateral thigh. No erythema, bruising or swelling. Negative homan's. Full ROM.  Neuro: Normal gait. PERLA. EOMi. Alert. Oriented x3   No exam data present No results found. No results  found for this or any previous visit (from the past 24 hour(s)).  Assessment/Plan: RECE ZECHMAN is a 67 y.o. male present for OV for  Left leg pain/Thrombophlebitis - mild TTP left lateral  Calf, suspect superficial thrombophlebitis as cause. Home care instructions provided from outside source. Ordered LE- doppler to be complete - US Venous Img Lower Unilateral Left; Future - If + for DVT will need to go through ED for anticoag initiation management.  - F/U PRN  Immunization due - Flu vaccine HIGH DOSE PF (Fluzone High dose) - Pneumococcal polysaccharide vaccine 23-valent greater than or equal to 2yo subcutaneous/IM   Reviewed expectations re: course of current medical issues.  Discussed self-management of symptoms.  Outlined signs and symptoms indicating need for more acute intervention.  Patient verbalized understanding and all questions were answered.  Patient received an After-Visit Summary.    No orders of the defined types were placed in this encounter.    Note is dictated utilizing voice recognition software. Although note has been proof read prior to signing, occasional typographical errors still can be missed. If any questions arise, please do not hesitate to call for verification.   electronically signed by:  Howard Pouch, DO  [place]

## 2018-05-09 NOTE — Patient Instructions (Addendum)
Pneumonia series completed today. Flu shot provided.  Shingrix script.   Get [place] of leg, they will call you to schedule.  Read of handout for thrombophlebitis.

## 2018-05-12 ENCOUNTER — Ambulatory Visit (HOSPITAL_BASED_OUTPATIENT_CLINIC_OR_DEPARTMENT_OTHER)
Admission: RE | Admit: 2018-05-12 | Discharge: 2018-05-12 | Disposition: A | Discharge status: Home / Self Care | Payer: Medicare Other | Source: Ambulatory Visit | Attending: Family Medicine | Admitting: Family Medicine

## 2018-05-12 DIAGNOSIS — M7989 Other specified soft tissue disorders: Secondary | ICD-10-CM | POA: Diagnosis not present

## 2018-05-12 DIAGNOSIS — M79662 Pain in left lower leg: Secondary | ICD-10-CM | POA: Diagnosis not present

## 2018-05-12 DIAGNOSIS — M79605 Pain in left leg: Secondary | ICD-10-CM | POA: Insufficient documentation

## 2018-05-21 ENCOUNTER — Other Ambulatory Visit: Payer: Self-pay | Admitting: Cardiovascular Disease

## 2018-05-21 DIAGNOSIS — E785 Hyperlipidemia, unspecified: Secondary | ICD-10-CM

## 2018-05-21 DIAGNOSIS — I2510 Atherosclerotic heart disease of native coronary artery without angina pectoris: Secondary | ICD-10-CM

## 2018-07-04 ENCOUNTER — Other Ambulatory Visit: Payer: Medicare Other

## 2018-07-04 DIAGNOSIS — I700 Atherosclerosis of aorta: Secondary | ICD-10-CM | POA: Diagnosis not present

## 2018-07-04 LAB — HEPATIC FUNCTION PANEL
ALT: 25 IU/L (ref 0–44)
AST: 25 IU/L (ref 0–40)
Albumin: 4.4 g/dL (ref 3.6–4.8)
Alkaline Phosphatase: 70 IU/L (ref 39–117)
Bilirubin Total: 0.7 mg/dL (ref 0.0–1.2)
Bilirubin, Direct: 0.19 mg/dL (ref 0.00–0.40)
Total Protein: 6.5 g/dL (ref 6.0–8.5)

## 2018-07-04 LAB — LIPID PANEL
Chol/HDL Ratio: 2.5 ratio (ref 0.0–5.0)
Cholesterol, Total: 141 mg/dL (ref 100–199)
HDL: 56 mg/dL (ref >39)
LDL Calculated: 67 mg/dL (ref 0–99)
TRIGLYCERIDES: 92 mg/dL (ref 0–149)
VLDL CHOLESTEROL CAL: 18 mg/dL (ref 5–40)

## 2018-10-08 DIAGNOSIS — H40023 Open angle with borderline findings, high risk, bilateral: Secondary | ICD-10-CM | POA: Diagnosis not present

## 2018-10-08 DIAGNOSIS — H26493 Other secondary cataract, bilateral: Secondary | ICD-10-CM | POA: Diagnosis not present

## 2018-11-03 DIAGNOSIS — H26491 Other secondary cataract, right eye: Secondary | ICD-10-CM | POA: Diagnosis not present

## 2018-11-03 DIAGNOSIS — H26493 Other secondary cataract, bilateral: Secondary | ICD-10-CM | POA: Diagnosis not present

## 2018-11-03 DIAGNOSIS — H26492 Other secondary cataract, left eye: Secondary | ICD-10-CM | POA: Diagnosis not present

## 2018-12-15 ENCOUNTER — Telehealth: Payer: Self-pay

## 2018-12-15 NOTE — Telephone Encounter (Signed)
Called and left detailed message on VM, okay per DPR, letting pt know they are due for their Medicare wellness exam. Pt advised this is to review preventive health issues and was being offered as a virtual visit with MD. Pt was asked to call office to get scheduled.

## 2019-01-14 DIAGNOSIS — H26492 Other secondary cataract, left eye: Secondary | ICD-10-CM | POA: Diagnosis not present

## 2019-02-09 ENCOUNTER — Other Ambulatory Visit: Payer: Self-pay

## 2019-02-09 ENCOUNTER — Encounter: Payer: Self-pay | Admitting: Family Medicine

## 2019-02-09 ENCOUNTER — Ambulatory Visit (INDEPENDENT_AMBULATORY_CARE_PROVIDER_SITE_OTHER): Payer: Medicare Other | Admitting: Family Medicine

## 2019-02-09 VITALS — BP 124/80 | HR 60 | Temp 98.3°F | Resp 16 | Ht 71.5 in | Wt 191.0 lb

## 2019-02-09 DIAGNOSIS — E663 Overweight: Secondary | ICD-10-CM

## 2019-02-09 DIAGNOSIS — I2510 Atherosclerotic heart disease of native coronary artery without angina pectoris: Secondary | ICD-10-CM

## 2019-02-09 DIAGNOSIS — Z0000 Encounter for general adult medical examination without abnormal findings: Secondary | ICD-10-CM

## 2019-02-09 DIAGNOSIS — J439 Emphysema, unspecified: Secondary | ICD-10-CM | POA: Diagnosis not present

## 2019-02-09 DIAGNOSIS — Z125 Encounter for screening for malignant neoplasm of prostate: Secondary | ICD-10-CM | POA: Diagnosis not present

## 2019-02-09 DIAGNOSIS — Z5320 Procedure and treatment not carried out because of patient's decision for unspecified reasons: Secondary | ICD-10-CM

## 2019-02-09 DIAGNOSIS — Z23 Encounter for immunization: Secondary | ICD-10-CM

## 2019-02-09 DIAGNOSIS — I252 Old myocardial infarction: Secondary | ICD-10-CM | POA: Insufficient documentation

## 2019-02-09 DIAGNOSIS — I700 Atherosclerosis of aorta: Secondary | ICD-10-CM | POA: Diagnosis not present

## 2019-02-09 DIAGNOSIS — Z79899 Other long term (current) drug therapy: Secondary | ICD-10-CM | POA: Insufficient documentation

## 2019-02-09 DIAGNOSIS — E785 Hyperlipidemia, unspecified: Secondary | ICD-10-CM

## 2019-02-09 DIAGNOSIS — Z86010 Personal history of colonic polyps: Secondary | ICD-10-CM

## 2019-02-09 DIAGNOSIS — Z131 Encounter for screening for diabetes mellitus: Secondary | ICD-10-CM

## 2019-02-09 HISTORY — DX: Procedure and treatment not carried out because of patient's decision for unspecified reasons: Z53.20

## 2019-02-09 LAB — CBC
HCT: 45.7 % (ref 39.0–52.0)
Hemoglobin: 15.3 g/dL (ref 13.0–17.0)
MCHC: 33.5 g/dL (ref 30.0–36.0)
MCV: 95.8 fl (ref 78.0–100.0)
Platelets: 169 10*3/uL (ref 150.0–400.0)
RBC: 4.77 Mil/uL (ref 4.22–5.81)
RDW: 13.1 % (ref 11.5–15.5)
WBC: 4 10*3/uL (ref 4.0–10.5)

## 2019-02-09 LAB — TSH: TSH: 1.31 u[IU]/mL (ref 0.35–4.50)

## 2019-02-09 LAB — COMPREHENSIVE METABOLIC PANEL
ALT: 25 U/L (ref 0–53)
AST: 22 U/L (ref 0–37)
Albumin: 4.1 g/dL (ref 3.5–5.2)
Alkaline Phosphatase: 65 U/L (ref 39–117)
BUN: 19 mg/dL (ref 6–23)
CO2: 24 mEq/L (ref 19–32)
Calcium: 8.8 mg/dL (ref 8.4–10.5)
Chloride: 106 mEq/L (ref 96–112)
Creatinine, Ser: 0.79 mg/dL (ref 0.40–1.50)
GFR: 97.59 mL/min (ref >60.00)
Glucose, Bld: 97 mg/dL (ref 70–99)
Potassium: 4.4 mEq/L (ref 3.5–5.1)
Sodium: 139 mEq/L (ref 135–145)
Total Bilirubin: 0.6 mg/dL (ref 0.2–1.2)
Total Protein: 6.4 g/dL (ref 6.0–8.3)

## 2019-02-09 LAB — PSA: PSA: 0.67 ng/mL (ref 0.10–4.00)

## 2019-02-09 LAB — HEMOGLOBIN A1C: Hgb A1c MFr Bld: 6 % (ref 4.6–6.5)

## 2019-02-09 NOTE — Patient Instructions (Signed)
Great to see you today! We will call you with all lab results once available.  You received your tetanus shot today.  You are due for your colonoscopy this year.  The rest of your health maintenance is up to date   Health Maintenance After Age 68 After age 24, you are at a higher risk for certain long-term diseases and infections as well as injuries from falls. Falls are a major cause of broken bones and head injuries in people who are older than age 51. Getting regular preventive care can help to keep you healthy and well. Preventive care includes getting regular testing and making lifestyle changes as recommended by your health care provider. Talk with your health care provider about:  Which screenings and tests you should have. A screening is a test that checks for a disease when you have no symptoms.  A diet and exercise plan that is right for you. What should I know about screenings and tests to prevent falls? Screening and testing are the best ways to find a health problem early. Early diagnosis and treatment give you the best chance of managing medical conditions that are common after age 52. Certain conditions and lifestyle choices may make you more likely to have a fall. Your health care provider may recommend:  Regular vision checks. Poor vision and conditions such as cataracts can make you more likely to have a fall. If you wear glasses, make sure to get your prescription updated if your vision changes.  Medicine review. Work with your health care provider to regularly review all of the medicines you are taking, including over-the-counter medicines. Ask your health care provider about any side effects that may make you more likely to have a fall. Tell your health care provider if any medicines that you take make you feel dizzy or sleepy.  Osteoporosis screening. Osteoporosis is a condition that causes the bones to get weaker. This can make the bones weak and cause them to break more  easily.  Blood pressure screening. Blood pressure changes and medicines to control blood pressure can make you feel dizzy.  Strength and balance checks. Your health care provider may recommend certain tests to check your strength and balance while standing, walking, or changing positions.  Foot health exam. Foot pain and numbness, as well as not wearing proper footwear, can make you more likely to have a fall.  Depression screening. You may be more likely to have a fall if you have a fear of falling, feel emotionally low, or feel unable to do activities that you used to do.  Alcohol use screening. Using too much alcohol can affect your balance and may make you more likely to have a fall. What actions can I take to lower my risk of falls? General instructions  Talk with your health care provider about your risks for falling. Tell your health care provider if: ? You fall. Be sure to tell your health care provider about all falls, even ones that seem minor. ? You feel dizzy, sleepy, or off-balance.  Take over-the-counter and prescription medicines only as told by your health care provider. These include any supplements.  Eat a healthy diet and maintain a healthy weight. A healthy diet includes low-fat dairy products, low-fat (lean) meats, and fiber from whole grains, beans, and lots of fruits and vegetables. Home safety  Remove any tripping hazards, such as rugs, cords, and clutter.  Install safety equipment such as grab bars in bathrooms and safety rails on stairs.  Keep rooms and walkways well-lit. Activity   Follow a regular exercise program to stay fit. This will help you maintain your balance. Ask your health care provider what types of exercise are appropriate for you.  If you need a cane or walker, use it as recommended by your health care provider.  Wear supportive shoes that have nonskid soles. Lifestyle  Do not drink alcohol if your health care provider tells you not to  drink.  If you drink alcohol, limit how much you have: ? 0-1 drink a day for women. ? 0-2 drinks a day for men.  Be aware of how much alcohol is in your drink. In the [place] one drink equals one typical bottle of beer (12 oz), one-half glass of wine (5 oz), or one shot of hard liquor (1 oz).  Do not use any products that contain nicotine or tobacco, such as cigarettes and e-cigarettes. If you need help quitting, ask your health care provider. Summary  Having a healthy lifestyle and getting preventive care can help to protect your health and wellness after age 47.  Screening and testing are the best way to find a health problem early and help you avoid having a fall. Early diagnosis and treatment give you the best chance for managing medical conditions that are more common for people who are older than age 76.  Falls are a major cause of broken bones and head injuries in people who are older than age 40. Take precautions to prevent a fall at home.  Work with your health care provider to learn what changes you can make to improve your health and wellness and to prevent falls. This information is not intended to replace advice given to you by your health care provider. Make sure you discuss any questions you have with your health care provider. Document Released: 07/03/2017 Document Revised: 07/03/2017 Document Reviewed: 07/03/2017 Elsevier Interactive Patient Education  2019 [employer].

## 2019-02-09 NOTE — Progress Notes (Signed)
Patient ID: [name] [name], male  DOB: [birth year], 68 y.o.   MRN: [id number] Patient Care Team    Relationship Specialty Notifications Start End  Ma Hillock, DO PCP - General Family Medicine  02/14/16   Sherren Mocha, MD PCP - Cardiology Cardiology Admissions 03/25/18   Doran Stabler, MD Consulting Physician Gastroenterology  02/09/19     Chief Complaint  Patient presents with  . Annual Exam    No complaints today    Subjective:  [name] [name] is a 68 y.o. male present for CPE. All past medical history, surgical history, allergies, family history, immunizations, medications and social history were updated in the electronic medical record today. All recent labs, ED visits and hospitalizations within the last year were reviewed.  Health maintenance:  Colonoscopy: last screen 07/2016, recommend follow up 3 years; Completed by Dr. Loletha Carrow. Immunizations:  tdap overdue- administered today, influenza UTD 2019, PNA series completed after 65, shingrix series completed Infectious disease screening: HIV declined PSA:  Lab Results  Component Value Date   PSA 1.10 02/13/2016  , pt was counseled on prostate cancer screenings.  Assistive device: none Oxygen EXB:MWUX Patient has a Dental home. Hospitalizations/ED visits: reviewed   Depression screen [facility] 2/9 02/09/2019 05/09/2018 05/27/2017  Decreased Interest 0 0 0  Down, Depressed, Hopeless 0 0 0  PHQ - 2 Score 0 0 0       Fall Risk  02/09/2019 05/09/2018  Falls in the past year? 0 No  Follow up Falls evaluation completed;Education provided;Falls prevention discussed -    Immunization History  Administered Date(s) Administered  . Influenza, High Dose Seasonal PF 05/09/2018  . Influenza,inj,Quad PF,6+ Mos 07/02/2017  . Pneumococcal Conjugate-13 07/02/2017  . Pneumococcal Polysaccharide-23 05/09/2018  . Zoster Recombinat (Shingrix) 05/28/2018, 08/29/2018    Past Medical History:  Diagnosis Date  . Chicken pox   .  Colon polyps   . Coronary artery disease   . Diverticulosis   . History of shingles   . History of tobacco use   . Hyperlipidemia   . Hypertension   . Internal hemorrhoid   . Myocardial infarction [name] 12-08-2007   stent   No Known Allergies Past Surgical History:  Procedure Laterality Date  . CATARACT EXTRACTION, BILATERAL  05/2016  . CORONARY STENT PLACEMENT    . KNEE ARTHROSCOPY     bilateral  . VASECTOMY    . WISDOM TOOTH EXTRACTION     Family History  Problem Relation Age of Onset  . Heart disease Father   . Heart disease Paternal Uncle   . Heart disease Mother   . Stroke Mother   . Stroke Sister   . Colon cancer Neg Hx   . Rectal cancer Neg Hx   . Stomach cancer Neg Hx    Social History   Social History Narrative   [name] [name] lives with his wife and her 3 children. He works at [employer] & frequently lifts heavy objects.    Allergies as of 02/09/2019   No Known Allergies     Medication List       Accurate as of February 09, 2019  9:41 AM. If you have any questions, ask your nurse or doctor.        aspirin 81 MG tablet Take 81 mg by mouth daily.   carvedilol 3.125 MG tablet Commonly known as:  COREG Take 1 tablet (3.125 mg total) by mouth 2 (two) times daily with a meal.  Co Q 10 100 MG Caps Take 1 capsule by mouth daily.   nitroGLYCERIN 0.4 MG SL tablet Commonly known as:  NITROSTAT Place 0.4 mg under the tongue every 5 (five) minutes as needed for chest pain.   rosuvastatin 20 MG tablet Commonly known as:  CRESTOR Take 1 tablet (20 mg total) by mouth daily.      All past medical history, surgical history, allergies, family history, immunizations andmedications were updated in the EMR today and reviewed under the history and medication portions of their EMR.      ROS: 14 pt review of systems performed and negative (unless mentioned in an HPI)  Objective: BP 124/80 (BP Location: Right Arm, Patient Position: Sitting, Cuff Size: Normal)    Pulse 60   Temp 98.3 F (36.8 C) (Temporal)   Resp 16   Ht 5' 11.5" (1.816 m)   Wt 191 lb (86.6 kg)   SpO2 94%   BMI 26.27 kg/m  Gen: Afebrile. No acute distress. Nontoxic in appearance, well-developed, well-nourished,  Pleasant caucasian male. Mildly overweight.  HENT: AT. [place]. Bilateral TM visualized and normal in appearance, normal external auditory canal. MMM, no oral lesions, adequate dentition. Bilateral nares within normal limits. Throat without erythema, ulcerations or exudates. no Cough on exam, no hoarseness on exam. Eyes:Pupils Equal Round Reactive to light, Extraocular movements intact,  Conjunctiva without redness, discharge or icterus. Neck/lymp/endocrine: Supple,no lymphadenopathy, no thyromegaly CV: RRR no murmur, no edema, +2/4 P posterior tibialis pulses. no carotid bruits. No JVD. Chest: CTAB, no wheeze, rhonchi or crackles. normal Respiratory effort. good Air movement. Abd: Soft. flat. NTND. BS presetn. no Masses palpated. No hepatosplenomegaly. No rebound tenderness or guarding. Skin: no rashes, purpura or petechiae. Warm and well-perfused. Skin intact. Neuro/Msk:  Normal gait. PERLA. EOMi. Alert. Oriented x3.  Cranial nerves II through XII intact. Muscle strength 5/5 upper/lower extremity. DTRs equal bilaterally. Psych: Normal affect, dress and demeanor. Normal speech. Normal thought content and judgment.  No exam data present  Assessment/plan: [name] is a 68 y.o. male present for CPE Encounter for preventive health examination/Overweight (BMI 25.0-29.9) Patient was encouraged to exercise greater than 150 minutes a week. Patient was encouraged to choose a diet filled with fresh fruits and vegetables, and lean meats. AVS provided to patient today for education/recommendation on gender specific health and safety maintenance. Colonoscopy: last screen 07/2016, recommend follow up 3 years; Completed by Dr. Danis.>> REFERRAL PLACED Immunizations:  tdap overdue-  administered today, influenza UTD 2019, PNA series completed after 65, shingrix series completed Infectious disease screening: HIV declined  Atherosclerosis of native coronary artery of native heart without angina pectoris/History of MI (myocardial infarction)/ Aortic atherosclerosis ([place]) - followed routinely by cardiology, Dr. Burt Knack  Pulmonary emphysema, unspecified emphysema type ([place]) - asymptomatic. Prior h/o smoking. Quit when he had his MI. Bikes daily. - CBC  History of colonic polyps - 3 year f/u colonoscopy due 08/2019 - Ambulatory referral to Gastroenterology  Hyperlipidemia LDL goal <100/on statin therapy.  - continue statin- lipids not needed today- completed 07/2018 - Comprehensive metabolic panel  Diabetes mellitus screening - Hemoglobin A1c  Prostate cancer screening - PSA  HIV screening declined     Return in about 1 year (around 02/09/2020) for CPE (30 min).  Note is dictated utilizing voice recognition software. Although note has been proof read prior to signing, occasional typographical errors still can be missed. If any questions arise, please do not hesitate to call for verification.  Electronically signed by: Howard Pouch,  DO [place]

## 2019-04-02 ENCOUNTER — Telehealth: Payer: Self-pay | Admitting: *Deleted

## 2019-04-02 NOTE — Telephone Encounter (Signed)
Pt has answered NO to the following covid19 prescreen questions.        COVID-19 Pre-Screening Questions:  . In the past 7 to 10 days have you had a cough,  shortness of breath, headache, congestion, fever (100 or greater) body aches, chills, sore throat, or sudden loss of taste or sense of smell? . Have you been around anyone with known Covid 19. . Have you been around anyone who is awaiting Covid 19 test results in the past 7 to 10 days? . Have you been around anyone who has been exposed to Covid 19, or has mentioned symptoms of Covid 19 within the past 7 to 10 days?  If you have any concerns/questions about symptoms patients report during screening (either on the phone or at threshold). Contact the provider seeing the patient or DOD for further guidance.  If neither are available contact a member of the leadership team.            

## 2019-04-03 ENCOUNTER — Other Ambulatory Visit: Payer: Self-pay

## 2019-04-03 ENCOUNTER — Ambulatory Visit (INDEPENDENT_AMBULATORY_CARE_PROVIDER_SITE_OTHER): Payer: Medicare Other | Admitting: Cardiology

## 2019-04-03 ENCOUNTER — Encounter: Payer: Self-pay | Admitting: Cardiology

## 2019-04-03 VITALS — BP 130/80 | HR 53 | Ht 71.5 in | Wt 192.0 lb

## 2019-04-03 DIAGNOSIS — I2510 Atherosclerotic heart disease of native coronary artery without angina pectoris: Secondary | ICD-10-CM

## 2019-04-03 DIAGNOSIS — E785 Hyperlipidemia, unspecified: Secondary | ICD-10-CM

## 2019-04-03 NOTE — Progress Notes (Signed)
Cardiology Office Note:    Date:  04/03/2019   ID:  [name] [name], DOB 04-16-1951, MRN [id number]  PCP:  Ma Hillock, DO  Cardiologist:  Sherren Mocha, MD  Referring MD: Ma Hillock, DO   Chief Complaint  Patient presents with  . Follow-up    CAD    History of Present Illness:    [name] [name] is a 68 y.o. male with a past medical history significant for CAD s/p MI with BMS to LAD 2009, emphysema, prior smoking-quit 2009, and hyperlipidemia.    He was last seen in the office on 03/31/2018 by Dr. Burt Knack.  A lipid panel revealed an LDL of 115.  Atorvastatin was switched to rosuvastatin and follow-up LDL was at goal of 67.  Mr. Frazzini is here today for 1 year follow up. He works now part time for [employer]. His job is physical. He stays busy and does yardwork.  He denies any chest pain/pressure, shortness of breath, lightheadedness, palpitations, orthopnea, PND, edema.   Patient says he eats salads, fruits and vegetables, and loves to grill out.  Past Medical History:  Diagnosis Date  . Chicken pox   . Colon polyps   . Coronary artery disease   . Diverticulosis   . History of shingles   . History of tobacco use   . Hyperlipidemia   . Hypertension   . Internal hemorrhoid   . Myocardial infarction [facility]) 12-08-2007   stent    Past Surgical History:  Procedure Laterality Date  . CATARACT EXTRACTION, BILATERAL  05/2016  . CORONARY STENT PLACEMENT    . KNEE ARTHROSCOPY     bilateral  . VASECTOMY    . WISDOM TOOTH EXTRACTION      Current Medications: Current Meds  Medication Sig  . aspirin 81 MG tablet Take 81 mg by mouth daily.    . carvedilol (COREG) 3.125 MG tablet Take 1 tablet (3.125 mg total) by mouth 2 (two) times daily with a meal.  . Coenzyme Q10 (CO Q 10) 100 MG CAPS Take 1 capsule by mouth daily.  . nitroGLYCERIN (NITROSTAT) 0.4 MG SL tablet Place 0.4 mg under the tongue every 5 (five) minutes as needed for chest pain.      Allergies:    Patient has no known allergies.   Social History   Socioeconomic History  . Marital status: Married    Spouse name: Not on file  . Number of children: Not on file  . Years of education: 18  . Highest education level: Not on file  Occupational History  . Occupation: Surveyor, quantity: [employer].  Social Needs  . Financial resource strain: Not on file  . Food insecurity    Worry: Not on file    Inability: Not on file  . Transportation needs    Medical: Not on file    Non-medical: Not on file  Tobacco Use  . Smoking status: Former Smoker    Packs/day: 1.00    Years: 40.00    Pack years: 40.00    Types: Cigarettes    Quit date: 12/03/2007    Years since quitting: 11.3  . Smokeless tobacco: Never Used  Substance and Sexual Activity  . Alcohol use: No  . Drug use: No  . Sexual activity: Yes  Lifestyle  . Physical activity    Days per week: Not on file    Minutes per session: Not on file  . Stress: Not  on file  Relationships  . Social Herbalist on phone: Not on file    Gets together: Not on file    Attends religious service: Not on file    Active member of club or organization: Not on file    Attends meetings of clubs or organizations: Not on file    Relationship status: Not on file  Other Topics Concern  . Not on file  Social History Narrative   [name] [name] lives with his wife and her 3 children. He works at [employer] & frequently lifts heavy objects.     Family History: The patient's family history includes Heart disease in his father, mother, and paternal uncle; Stroke in his mother and sister. There is no history of Colon cancer, Rectal cancer, or Stomach cancer. ROS:   Please see the history of present illness.     All other systems reviewed and are negative.  EKGs/Labs/Other Studies Reviewed:    The following studies were reviewed today:  No recent studies   EKG:  EKG is ordered today.  The ekg ordered today  demonstrates Inus bradycardia 53 bpm,   Recent Labs: 02/09/2019: ALT 25; BUN 19; Creatinine, Ser 0.79; Hemoglobin 15.3; Platelets 169.0; Potassium 4.4; Sodium 139; TSH 1.31   Recent Lipid Panel    Component Value Date/Time   CHOL 141 07/04/2018 0803   TRIG 92 07/04/2018 0803   HDL 56 07/04/2018 0803   CHOLHDL 2.5 07/04/2018 0803   CHOLHDL 3.1 02/13/2016 0733   VLDL 19 02/13/2016 0733   LDLCALC 67 07/04/2018 0803    Physical Exam:    VS:  BP 130/80   Pulse (!) 53   Ht 5' 11.5" (1.816 m)   Wt 192 lb (87.1 kg)   SpO2 92%   BMI 26.41 kg/m     Wt Readings from Last 3 Encounters:  04/03/19 192 lb (87.1 kg)  02/09/19 191 lb (86.6 kg)  05/09/18 190 lb (86.2 kg)     Physical Exam  Constitutional: He is oriented to person, place, and time. He appears well-developed and well-nourished. No distress.  HENT:  Head: Normocephalic and atraumatic.  Neck: Normal range of motion. Neck supple.  Pulmonary/Chest: Effort normal and breath sounds normal. No respiratory distress. He has no wheezes. He has no rales.  Abdominal: Soft. Bowel sounds are normal.  Musculoskeletal: Normal range of motion.        General: No edema.  Neurological: He is alert and oriented to person, place, and time.  Skin: Skin is warm and dry.  Psychiatric: He has a normal mood and affect. His behavior is normal. Judgment and thought content normal.  Vitals reviewed.   ASSESSMENT:    1. Coronary artery disease involving native coronary artery of native heart without angina pectoris   2. Hyperlipidemia, unspecified hyperlipidemia type    PLAN:    In order of problems listed above:  CAD: Status post MI with bare-metal stent to the LAD in 2009.  Patient has had no subsequent ischemic events.  He continues on aspirin 81 mg, beta-blocker and statin.  He is quite active with no anginal symptoms, is doing very well. Eats lots of salads, fruits and vegetables and loves to grill out. Very little prepared foods.    Hyperlipidemia with goal LDL <70:  LDL was 115 last year.  Atorvastatin was switched to rosuvastatin and follow-up LDL was at goal of 67 in November 2019.  Continue current therapy   Medication Adjustments/Labs and Tests  Ordered: Current medicines are reviewed at length with the patient today.  Concerns regarding medicines are outlined above. Labs and tests ordered and medication changes are outlined in the patient instructions below:  Patient Instructions  Medication Instructions:  Your physician recommends that you continue on your current medications as directed. Please refer to the Current Medication list given to you today.  If you need a refill on your cardiac medications before your next appointment, please call your pharmacy.   Lab work: None   If you have labs (blood work) drawn today and your tests are completely normal, you will receive your results only by: Marland Kitchen MyChart Message (if you have MyChart) OR . A paper copy in the mail If you have any lab test that is abnormal or we need to change your treatment, we will call you to review the results.  Testing/Procedures: None    Follow-Up: At [facility], you and your health needs are our priority.  As part of our continuing mission to provide you with exceptional heart care, we have created designated Provider Care Teams.  These Care Teams include your primary Cardiologist (physician) and Advanced Practice Providers (APPs -  Physician Assistants and Nurse Practitioners) who all work together to provide you with the care you need, when you need it. You will need a follow up appointment in:  12 months.  Please call our office 2 months in advance to schedule this appointment.  You may see Sherren Mocha, MD or one of the following Advanced Practice Providers on your designated Care Team: Richardson Dopp, PA-C [place], [place] . Daune Perch, NP  Any Other Special Instructions Will Be Listed Below (If Applicable)  Lifestyle  Modifications to Prevent and Treat Heart Disease -Recommend heart healthy/Mediterranean diet, with whole grains, fruits, vegetable, fish, lean meats, nuts, and olive oil.  -Limit salt. -Recommend moderate walking, 3-5 times/week for 30-50 minutes each session. Aim for at least 150 minutes.week. Goal should be pace of 3 miles/hours, or walking 1.5 miles in 30 minutes -Recommend avoidance of tobacco products. Avoid excess alcohol. -Keep blood pressure well controlled, ideally less than 130/80.      Signed, Daune Perch, NP  04/03/2019 9:14 AM    [facility]

## 2019-04-03 NOTE — Patient Instructions (Addendum)
Medication Instructions:  Your physician recommends that you continue on your current medications as directed. Please refer to the Current Medication list given to you today.  If you need a refill on your cardiac medications before your next appointment, please call your pharmacy.   Lab work: None   If you have labs (blood work) drawn today and your tests are completely normal, you will receive your results only by: Marland Kitchen MyChart Message (if you have MyChart) OR . A paper copy in the mail If you have any lab test that is abnormal or we need to change your treatment, we will call you to review the results.  Testing/Procedures: None    Follow-Up: At [facility], you and your health needs are our priority.  As part of our continuing mission to provide you with exceptional heart care, we have created designated Provider Care Teams.  These Care Teams include your primary Cardiologist (physician) and Advanced Practice Providers (APPs -  Physician Assistants and Nurse Practitioners) who all work together to provide you with the care you need, when you need it. You will need a follow up appointment in:  12 months.  Please call our office 2 months in advance to schedule this appointment.  You may see Sherren Mocha, MD or one of the following Advanced Practice Providers on your designated Care Team: Richardson Dopp, PA-C [place], [place] . Daune Perch, NP  Any Other Special Instructions Will Be Listed Below (If Applicable)  Lifestyle Modifications to Prevent and Treat Heart Disease -Recommend heart healthy/Mediterranean diet, with whole grains, fruits, vegetable, fish, lean meats, nuts, and olive oil.  -Limit salt. -Recommend moderate walking, 3-5 times/week for 30-50 minutes each session. Aim for at least 150 minutes.week. Goal should be pace of 3 miles/hours, or walking 1.5 miles in 30 minutes -Recommend avoidance of tobacco products. Avoid excess alcohol. -Keep blood pressure well controlled,  ideally less than 130/80.

## 2019-04-11 ENCOUNTER — Other Ambulatory Visit: Payer: Self-pay

## 2019-04-11 ENCOUNTER — Other Ambulatory Visit: Payer: Self-pay | Admitting: Cardiovascular Disease

## 2019-04-13 MED ORDER — ROSUVASTATIN CALCIUM 20 MG PO TABS: 20.0000 mg | ORAL_TABLET | Freq: Every day | ORAL | 3 refills | Status: DC

## 2019-05-20 ENCOUNTER — Ambulatory Visit: Payer: Medicare Other

## 2019-06-01 ENCOUNTER — Other Ambulatory Visit: Payer: Self-pay

## 2019-06-01 ENCOUNTER — Ambulatory Visit (INDEPENDENT_AMBULATORY_CARE_PROVIDER_SITE_OTHER): Payer: Medicare Other

## 2019-06-01 DIAGNOSIS — E785 Hyperlipidemia, unspecified: Secondary | ICD-10-CM

## 2019-06-01 DIAGNOSIS — Z23 Encounter for immunization: Secondary | ICD-10-CM | POA: Diagnosis not present

## 2019-06-01 DIAGNOSIS — I2510 Atherosclerotic heart disease of native coronary artery without angina pectoris: Secondary | ICD-10-CM

## 2019-06-01 MED ORDER — CARVEDILOL 3.125 MG PO TABS: 3.1250 mg | ORAL_TABLET | Freq: Two times a day (BID) | ORAL | 2 refills | Status: DC

## 2019-06-04 ENCOUNTER — Encounter: Payer: Self-pay | Admitting: Gastroenterology

## 2019-06-29 ENCOUNTER — Other Ambulatory Visit: Payer: Self-pay

## 2019-06-29 ENCOUNTER — Encounter: Payer: Self-pay | Admitting: Gastroenterology

## 2019-06-29 ENCOUNTER — Ambulatory Visit (AMBULATORY_SURGERY_CENTER): Payer: Medicare Other | Admitting: *Deleted

## 2019-06-29 VITALS — Temp 96.9°F | Ht 71.5 in | Wt 194.0 lb

## 2019-06-29 DIAGNOSIS — Z86010 Personal history of colonic polyps: Secondary | ICD-10-CM

## 2019-06-29 DIAGNOSIS — Z1159 Encounter for screening for other viral diseases: Secondary | ICD-10-CM

## 2019-06-29 MED ORDER — NA SULFATE-K SULFATE-MG SULF 17.5-3.13-1.6 GM/177ML PO SOLN: ORAL | 0 refills | Status: DC

## 2019-06-29 NOTE — Progress Notes (Signed)
Patient is here in-person for PV. Patient denies any allergies to eggs or soy. Patient denies any problems with anesthesia/sedation. Patient denies any oxygen use at home. Patient denies taking any diet/weight loss medications or blood thinners. Patient is not being treated for MRSA or C-diff. EMMI education assisgned to patient on colonoscopy, this was explained and instructions given to patient.   Pt is aware that care partner will wait in the car during procedure; if they feel like they will be too hot or cold to wait in the car; they may wait in the 4 th floor lobby. Patient is aware to bring only one care partner. We want them to wear a mask (we do not have any that we can provide them), practice social distancing, and we will check their temperatures when they get here.  I did remind the patient that their care partner needs to stay in the parking lot the entire time and have a cell phone available, we will call them when the pt is ready for discharge. Patient will wear mask into building.  COVID screening test on 11/4/pt is aware!

## 2019-07-08 ENCOUNTER — Other Ambulatory Visit: Payer: Self-pay | Admitting: Gastroenterology

## 2019-07-08 DIAGNOSIS — Z1159 Encounter for screening for other viral diseases: Secondary | ICD-10-CM | POA: Diagnosis not present

## 2019-07-08 LAB — SARS CORONAVIRUS 2 (TAT 6-24 HRS): SARS Coronavirus 2: NEGATIVE

## 2019-07-13 ENCOUNTER — Other Ambulatory Visit: Payer: Self-pay

## 2019-07-13 ENCOUNTER — Encounter: Payer: Self-pay | Admitting: Gastroenterology

## 2019-07-13 ENCOUNTER — Ambulatory Visit (AMBULATORY_SURGERY_CENTER): Payer: Medicare Other | Admitting: Gastroenterology

## 2019-07-13 VITALS — BP 113/74 | HR 60 | Temp 98.6°F | Resp 16

## 2019-07-13 DIAGNOSIS — Z86010 Personal history of colonic polyps: Secondary | ICD-10-CM

## 2019-07-13 MED ORDER — SODIUM CHLORIDE 0.9 % IV SOLN: 500.0000 mL | Freq: Once | INTRAVENOUS | Status: DC

## 2019-07-13 NOTE — Progress Notes (Signed)
Vitals-CW Temp-JB  Pt's states no medical or surgical changes since previsit or office visit. 

## 2019-07-13 NOTE — Patient Instructions (Signed)
Thank you for allowing [place] to care for you today !  Resume previous diet and medications today.  Return to your normal activities tomorrow.  Recommend next screening colonoscopy in 7 years.   YOU HAD AN ENDOSCOPIC PROCEDURE TODAY AT [facility]:   Refer to the procedure report that was given to you for any specific questions about what was found during the examination.  If the procedure report does not answer your questions, please call your gastroenterologist to clarify.  If you requested that your care partner not be given the details of your procedure findings, then the procedure report has been included in a sealed envelope for you to review at your convenience later.  YOU SHOULD EXPECT: Some feelings of bloating in the abdomen. Passage of more gas than usual.  Walking can help get rid of the air that was put into your GI tract during the procedure and reduce the bloating. If you had a lower endoscopy (such as a colonoscopy or flexible sigmoidoscopy) you may notice spotting of blood in your stool or on the toilet paper. If you underwent a bowel prep for your procedure, you may not have a normal bowel movement for a few days.  Please Note:  You might notice some irritation and congestion in your nose or some drainage.  This is from the oxygen used during your procedure.  There is no need for concern and it should clear up in a day or so.  SYMPTOMS TO REPORT IMMEDIATELY:   Following lower endoscopy (colonoscopy or flexible sigmoidoscopy):  Excessive amounts of blood in the stool  Significant tenderness or worsening of abdominal pains  Swelling of the abdomen that is new, acute  Fever of 100F or higher   For urgent or emergent issues, a gastroenterologist can be reached at any hour by calling [phone].   DIET:  We do recommend a small meal at first, but then you may proceed to your regular diet.  Drink plenty of fluids but you should avoid alcoholic beverages for  24 hours.  ACTIVITY:  You should plan to take it easy for the rest of today and you should NOT DRIVE or use heavy machinery until tomorrow (because of the sedation medicines used during the test).    FOLLOW UP: Our staff will call the number listed on your records 48-72 hours following your procedure to check on you and address any questions or concerns that you may have regarding the information given to you following your procedure. If we do not reach you, we will leave a message.  We will attempt to reach you two times.  During this call, we will ask if you have developed any symptoms of COVID 19. If you develop any symptoms (ie: fever, flu-like symptoms, shortness of breath, cough etc.) before then, please call [phone].  If you test positive for Covid 19 in the 2 weeks post procedure, please call and report this information to [place].    If any biopsies were taken you will be contacted by phone or by letter within the next 1-3 weeks.  Please call us at [phone] if you have not heard about the biopsies in 3 weeks.    SIGNATURES/CONFIDENTIALITY: You and/or your care partner have signed paperwork which will be entered into your electronic medical record.  These signatures attest to the fact that that the information above on your After Visit Summary has been reviewed and is understood.  Full responsibility of the confidentiality of this  discharge information lies with you and/or your care-partner. 

## 2019-07-13 NOTE — Op Note (Signed)
[facility] Patient Name: [name] [name] Procedure Date: 07/13/2019 10:37 AM MRN: [id number] Endoscopist: Mallie Mussel L. Loletha Carrow , MD Age: 68 Referring MD:  Date of Birth: Mar 22, 1951 Gender: Male Account #: [id number] Procedure:                Colonoscopy Indications:              Surveillance: Personal history of adenomatous                            polyps on last colonoscopy 3 years ago (3                            diminutive adenomas 07/2016; 4 adenomas, including                            one 49mm, 07/2013) Medicines:                Monitored Anesthesia Care Procedure:                Pre-Anesthesia Assessment:                           - Prior to the procedure, a History and Physical                            was performed, and patient medications and                            allergies were reviewed. The patient's tolerance of                            previous anesthesia was also reviewed. The risks                            and benefits of the procedure and the sedation                            options and risks were discussed with the patient.                            All questions were answered, and informed consent                            was obtained. Prior Anticoagulants: The patient has                            taken no previous anticoagulant or antiplatelet                            agents except for aspirin. ASA Grade Assessment: II                            - A patient with mild systemic disease. After  reviewing the risks and benefits, the patient was                            deemed in satisfactory condition to undergo the                            procedure.                           After obtaining informed consent, the colonoscope                            was passed under direct vision. Throughout the                            procedure, the patient's blood pressure, pulse, and                            oxygen  saturations were monitored continuously. The                            Colonoscope was introduced through the anus and                            advanced to the the cecum, identified by                            appendiceal orifice and ileocecal valve. The                            colonoscopy was performed without difficulty. The                            patient tolerated the procedure well. The quality                            of the bowel preparation was good. The ileocecal                            valve, appendiceal orifice, and rectum were                            photographed. Scope In: 10:48:19 AM Scope Out: 11:03:08 AM Scope Withdrawal Time: 0 hours 10 minutes 19 seconds  Total Procedure Duration: 0 hours 14 minutes 49 seconds  Findings:                 The perianal and digital rectal examinations were                            normal.                           A few small-mouthed diverticula were found in the  sigmoid colon.                           The exam was otherwise without abnormality on                            direct and retroflexion views. Complications:            No immediate complications. Estimated Blood Loss:     Estimated blood loss: none. Impression:               - Diverticulosis in the sigmoid colon.                           - The examination was otherwise normal on direct                            and retroflexion views.                           - No specimens collected. Recommendation:           - Patient has a contact number available for                            emergencies. The signs and symptoms of potential                            delayed complications were discussed with the                            patient. Return to normal activities tomorrow.                            Written discharge instructions were provided to the                            patient.                           - Resume  previous diet.                           - Continue present medications.                           - Repeat colonoscopy in 7 years for surveillance                            due to personal history of polyps. Henry L. Loletha Carrow, MD 07/13/2019 11:08:35 AM This report has been signed electronically.

## 2019-07-15 ENCOUNTER — Telehealth: Payer: Self-pay | Admitting: *Deleted

## 2019-07-15 ENCOUNTER — Telehealth: Payer: Self-pay

## 2019-07-15 NOTE — Telephone Encounter (Signed)
  Follow up Call-  Call back number 07/13/2019  Post procedure Call Back phone  # [phone]  Permission to leave phone message No  Some recent data might be hidden    No answer,  Voicemail not set up.  Will call back later

## 2019-07-15 NOTE — Telephone Encounter (Signed)
  Follow up Call-  Call back number 07/13/2019  Post procedure Call Back phone  # [phone]  Permission to leave phone message No  Some recent data might be hidden     Patient questions:  Do you have a fever, pain , or abdominal swelling? No. Pain Score  0 *  Have you tolerated food without any problems? Yes.    Have you been able to return to your normal activities? Yes.    Do you have any questions about your discharge instructions: Diet   No. Medications  No. Follow up visit  No.  Do you have questions or concerns about your Care? No.  Actions: * If pain score is 4 or above: No action needed, pain <4.  1. Have you developed a fever since your procedure? no  2.   Have you had an respiratory symptoms (SOB or cough) since your procedure? no  3.   Have you tested positive for COVID 19 since your procedure no  4.   Have you had any family members/close contacts diagnosed with the COVID 19 since your procedure?  no   If yes to any of these questions please route to Joylene John, RN and Alphonsa Gin, Therapist, sports.

## 2019-09-12 IMAGING — US US EXTREM LOW VENOUS*L*
1 series · 14 of 24 positions shown · non-contrast
Comparison: None.

CLINICAL DATA: Left shin pain for several days

EXAM:
LEFT LOWER EXTREMITY VENOUS DUPLEX ULTRASOUND
TECHNIQUE: Doppler venous assessment of the left lower extremity deep venous
system was performed, including characterization of spectral flow,
compressibility, and phasicity.

[Series 1: us extrem low venous*left* · 0.08mm/px · 14 of 27 slices shown]
[im 1/27]
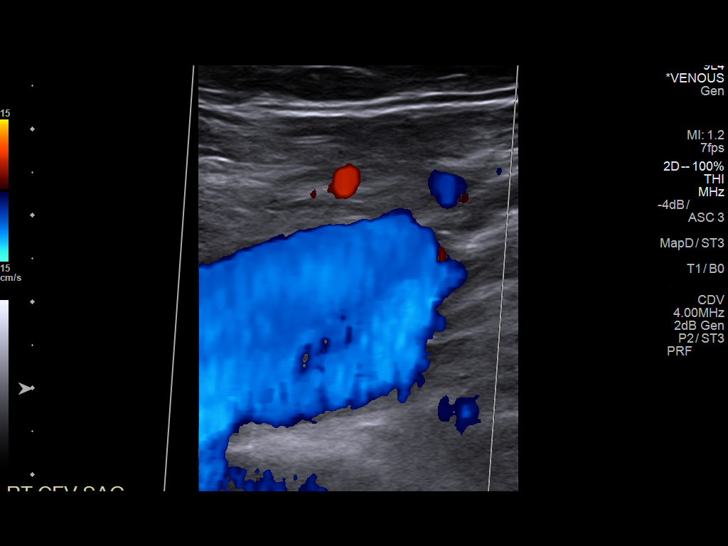
[im 3/27]
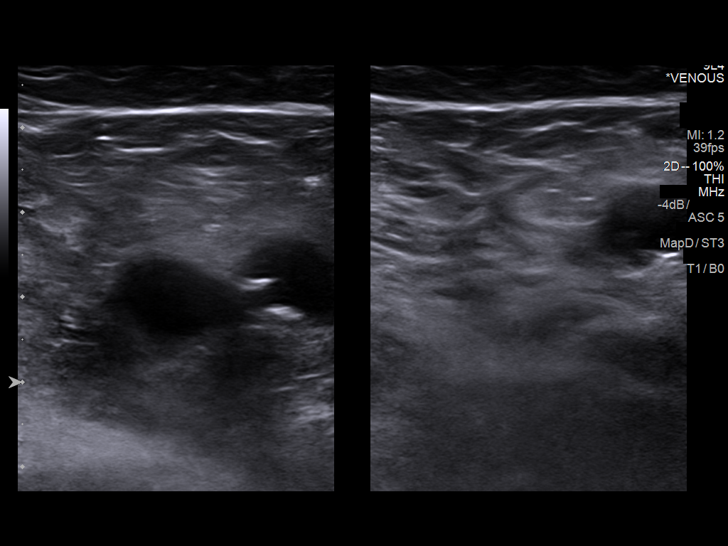
[im 5/27]
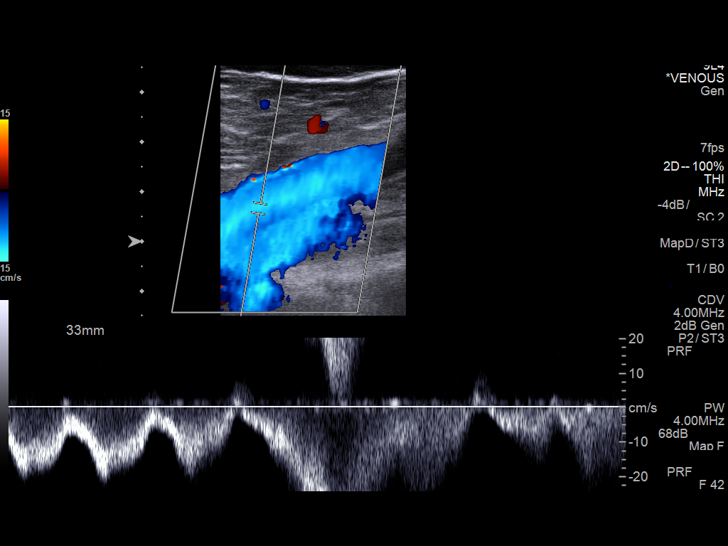
[im 7/27]
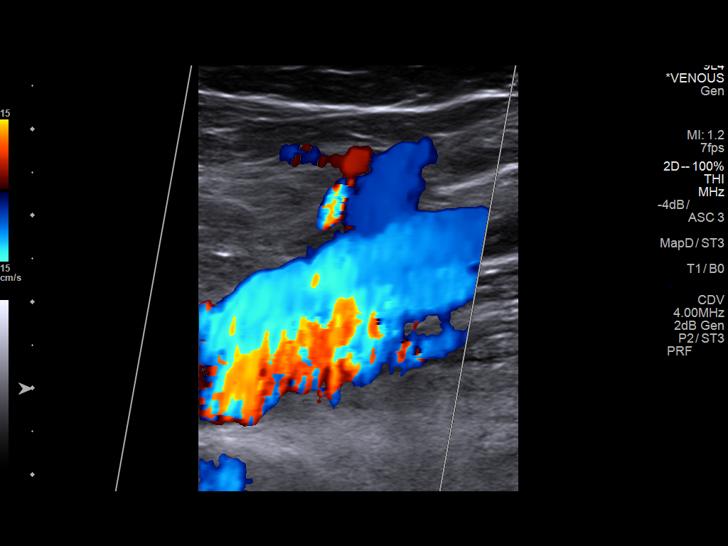
[im 8/27]
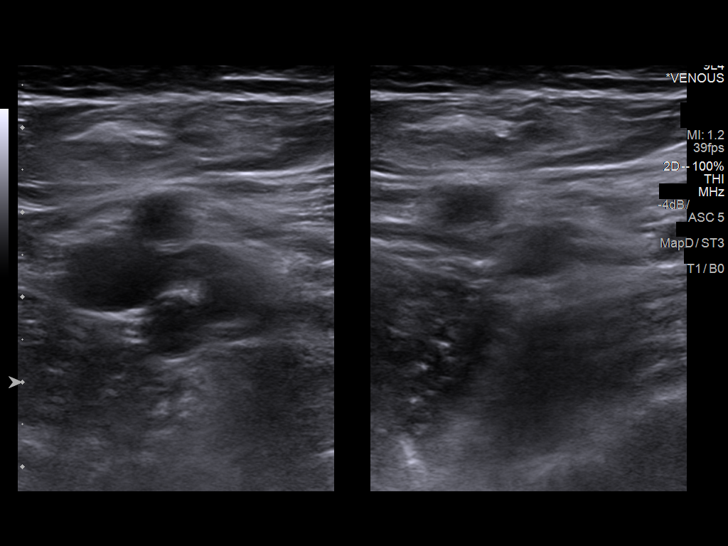
[im 11/27]
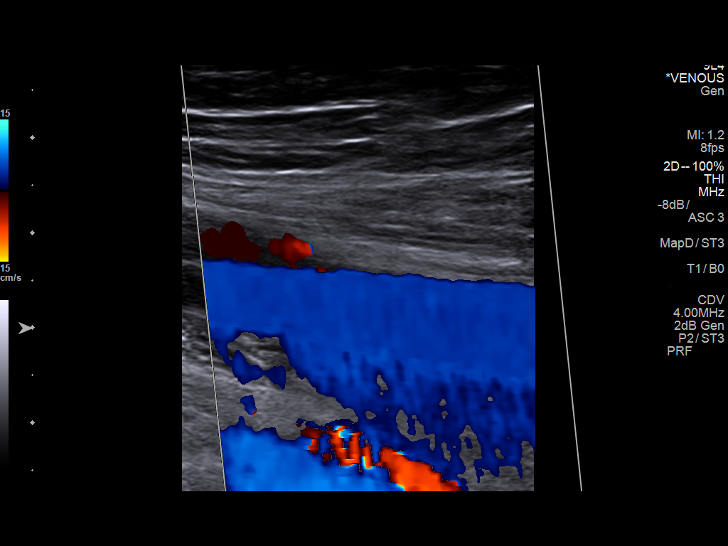
[im 13/27]
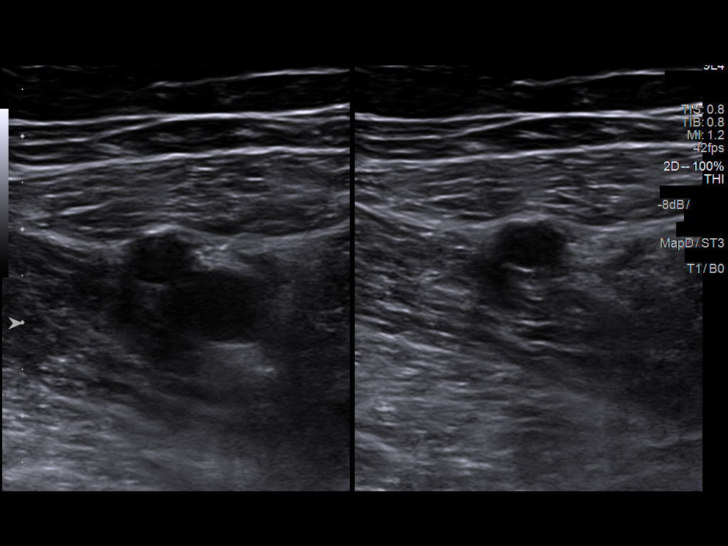
[im 14/27]
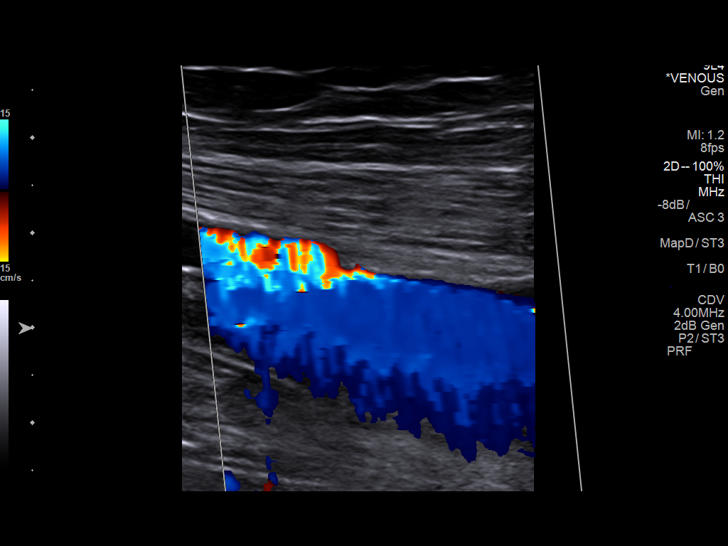
[im 16/27]
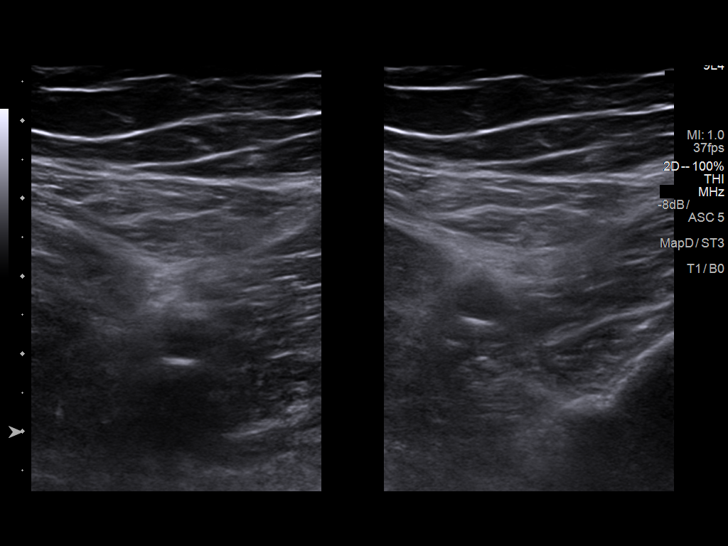
[im 19/27]
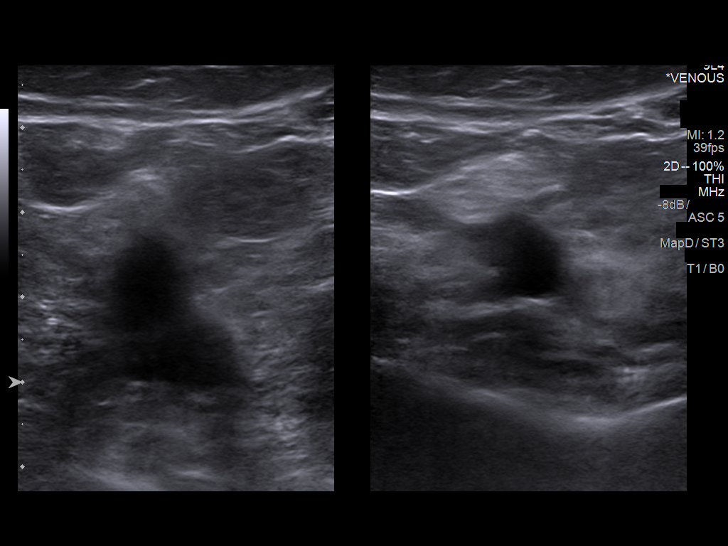
[im 21/27]
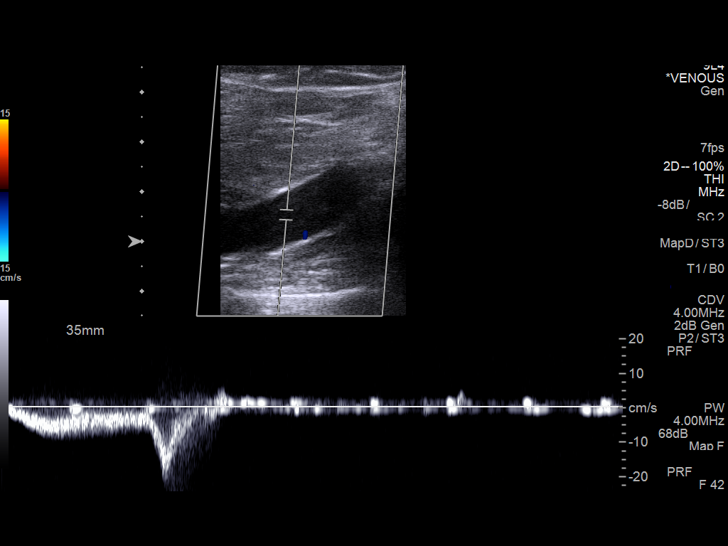
[im 22/27]
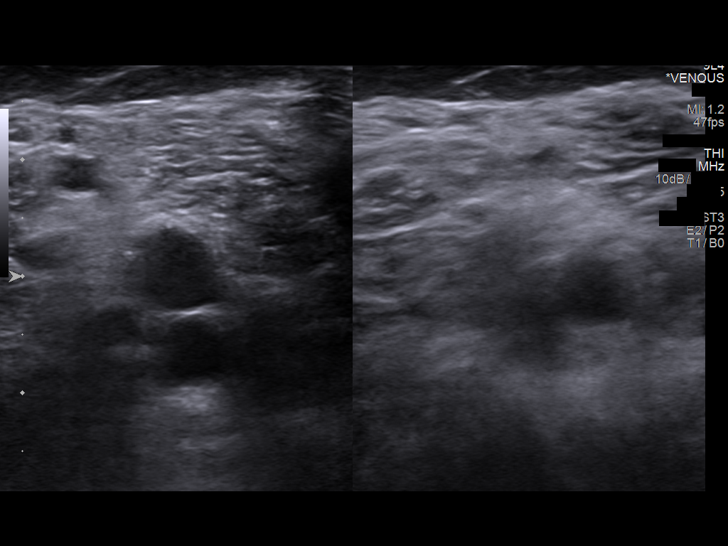
[im 24/27]
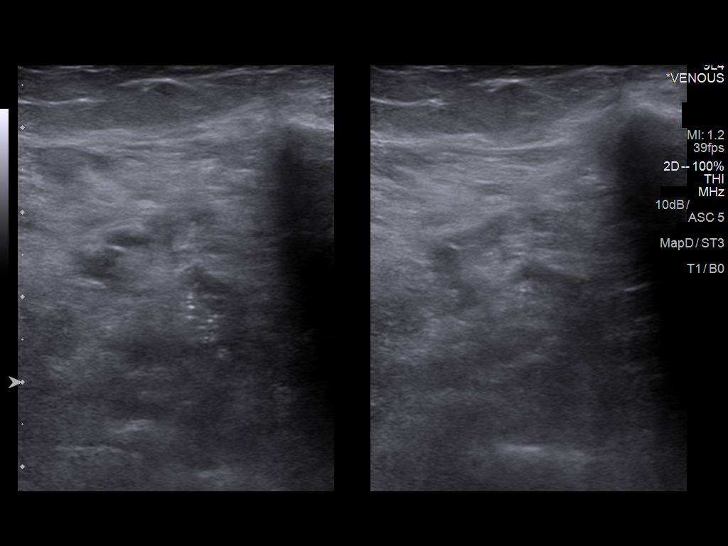
[im 27/27]
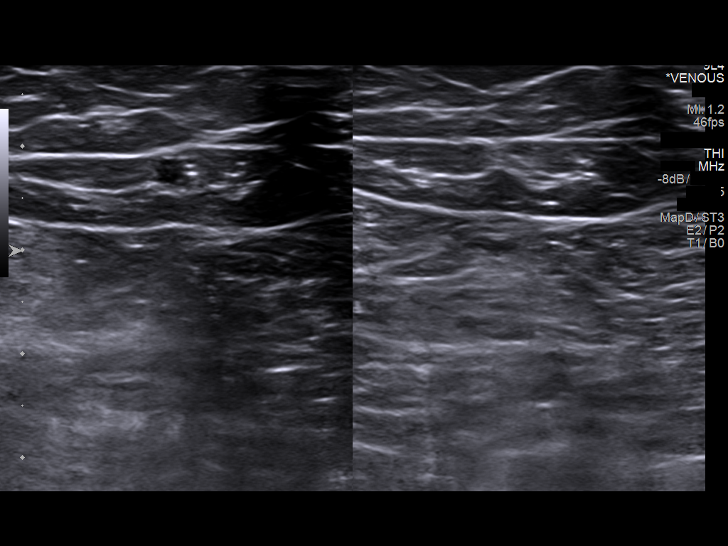

[14 of 24 positions shown; findings below may reference images not displayed]

FINDINGS: There is complete compressibility of the left common femoral,
femoral, and popliteal veins. Doppler analysis demonstrates
respiratory phasicity and augmentation of flow with calf
compression. No obvious superficial vein or calf vein thrombosis.
IMPRESSION: No evidence of left lower extremity DVT.

## 2019-10-03 ENCOUNTER — Ambulatory Visit: Payer: Medicare Other

## 2019-10-08 ENCOUNTER — Ambulatory Visit: Payer: Medicare Other | Attending: Internal Medicine

## 2019-10-08 DIAGNOSIS — Z23 Encounter for immunization: Secondary | ICD-10-CM | POA: Insufficient documentation

## 2019-10-08 NOTE — Progress Notes (Signed)
   Covid-19 Vaccination Clinic  Name:  [name] [name]    MRN: [id number] DOB: [birth year]  10/08/2019  Mr. [name] [name] was observed post Covid-19 immunization for 15 minutes without incidence. He was provided with Vaccine Information Sheet and instruction to access the V-Safe system.   Mr. [name] [name] was instructed to call 911 with any severe reactions post vaccine: Marland Kitchen Difficulty breathing  . Swelling of your face and throat  . A fast heartbeat  . A bad rash all over your body  . Dizziness and weakness    Immunizations Administered    Name Date Dose VIS Date Route   Pfizer COVID-19 Vaccine 10/08/2019 10:23 AM 0.3 mL 08/14/2019 Intramuscular   Manufacturer: [place]   Lot: [id number]   [place]: [id number]

## 2019-10-24 ENCOUNTER — Ambulatory Visit: Payer: Medicare Other

## 2019-11-02 ENCOUNTER — Ambulatory Visit: Payer: Medicare Other | Attending: Internal Medicine

## 2019-11-02 DIAGNOSIS — Z23 Encounter for immunization: Secondary | ICD-10-CM

## 2019-11-02 NOTE — Progress Notes (Signed)
   Covid-19 Vaccination Clinic  Name:  [name] [name]    MRN: [id number] DOB: [birth year]  11/02/2019  [name] [name] was observed post Covid-19 immunization for 15 minutes without incidence. He was provided with Vaccine Information Sheet and instruction to access the V-Safe system.   [name] [name] was instructed to call 911 with any severe reactions post vaccine: Marland Kitchen Difficulty breathing  . Swelling of your face and throat  . A fast heartbeat  . A bad rash all over your body  . Dizziness and weakness    Immunizations Administered    Name Date Dose VIS Date Route   Pfizer COVID-19 Vaccine 11/02/2019 10:06 AM 0.3 mL 08/14/2019 Intramuscular   Manufacturer: [place]   Lot: [id number]   [place]: [id number]

## 2020-03-21 ENCOUNTER — Other Ambulatory Visit: Payer: Self-pay

## 2020-03-21 DIAGNOSIS — E785 Hyperlipidemia, unspecified: Secondary | ICD-10-CM

## 2020-03-21 DIAGNOSIS — I2510 Atherosclerotic heart disease of native coronary artery without angina pectoris: Secondary | ICD-10-CM

## 2020-03-21 MED ORDER — CARVEDILOL 3.125 MG PO TABS: 3.1250 mg | ORAL_TABLET | Freq: Two times a day (BID) | ORAL | 0 refills | Status: DC

## 2020-04-14 ENCOUNTER — Telehealth: Payer: Self-pay | Admitting: Pulmonary Disease

## 2020-04-14 NOTE — Telephone Encounter (Signed)
Called and left message for patient to return call.  

## 2020-04-15 NOTE — Telephone Encounter (Signed)
Noted  

## 2020-05-17 ENCOUNTER — Other Ambulatory Visit: Payer: Self-pay | Admitting: Cardiovascular Disease

## 2020-05-30 ENCOUNTER — Encounter: Payer: Self-pay | Admitting: Cardiovascular Disease

## 2020-05-30 ENCOUNTER — Other Ambulatory Visit: Payer: Self-pay

## 2020-05-30 ENCOUNTER — Ambulatory Visit: Payer: Medicare Other | Admitting: Cardiovascular Disease

## 2020-05-30 VITALS — BP 130/84 | HR 52 | Ht 72.0 in | Wt 175.2 lb

## 2020-05-30 DIAGNOSIS — R0602 Shortness of breath: Secondary | ICD-10-CM | POA: Diagnosis not present

## 2020-05-30 DIAGNOSIS — E782 Mixed hyperlipidemia: Secondary | ICD-10-CM | POA: Diagnosis not present

## 2020-05-30 DIAGNOSIS — R7989 Other specified abnormal findings of blood chemistry: Secondary | ICD-10-CM | POA: Diagnosis not present

## 2020-05-30 DIAGNOSIS — I2510 Atherosclerotic heart disease of native coronary artery without angina pectoris: Secondary | ICD-10-CM | POA: Diagnosis not present

## 2020-05-30 LAB — LIPID PANEL
Chol/HDL Ratio: 2.4 ratio (ref 0.0–5.0)
Cholesterol, Total: 139 mg/dL (ref 100–199)
HDL: 58 mg/dL (ref >39)
LDL Chol Calc (NIH): 63 mg/dL (ref 0–99)
Triglycerides: 96 mg/dL (ref 0–149)
VLDL Cholesterol Cal: 18 mg/dL (ref 5–40)

## 2020-05-30 LAB — HEPATIC FUNCTION PANEL
ALT: 24 IU/L (ref 0–44)
AST: 16 IU/L (ref 0–40)
Albumin: 4.4 g/dL (ref 3.8–4.8)
Alkaline Phosphatase: 87 IU/L (ref 44–121)
Bilirubin Total: 0.7 mg/dL (ref 0.0–1.2)
Bilirubin, Direct: 0.18 mg/dL (ref 0.00–0.40)
Total Protein: 6.6 g/dL (ref 6.0–8.5)

## 2020-05-30 LAB — BASIC METABOLIC PANEL
BUN/Creatinine Ratio: 10 (ref 10–24)
BUN: 10 mg/dL (ref 8–27)
CO2: 27 mmol/L (ref 20–29)
Calcium: 9.5 mg/dL (ref 8.6–10.2)
Chloride: 102 mmol/L (ref 96–106)
Creatinine, Ser: 1.01 mg/dL (ref 0.76–1.27)
GFR calc Af Amer: 87 mL/min/{1.73_m2} (ref >59)
GFR calc non Af Amer: 76 mL/min/{1.73_m2} (ref >59)
Glucose: 89 mg/dL (ref 65–99)
Potassium: 4.6 mmol/L (ref 3.5–5.2)
Sodium: 140 mmol/L (ref 134–144)

## 2020-05-30 MED ORDER — ROSUVASTATIN CALCIUM 20 MG PO TABS: 20.0000 mg | ORAL_TABLET | Freq: Every day | ORAL | 3 refills | Status: DC

## 2020-05-30 MED ORDER — CARVEDILOL 3.125 MG PO TABS: 3.1250 mg | ORAL_TABLET | Freq: Two times a day (BID) | ORAL | 3 refills | Status: DC

## 2020-05-30 NOTE — Progress Notes (Signed)
Cardiology Office Note:    Date:  05/30/2020   ID:  RABON SCHOLLE, DOB [birth year], MRN [id number]  PCP:  Ma Hillock, DO  [place] Cardiologist:  Sherren Mocha, MD  [place] Electrophysiologist:  None   Referring MD: Ma Hillock, DO   Chief Complaint  Patient presents with  . Coronary Artery Disease    History of Present Illness:    [name] [name] is a 69 y.o. male with a hx of coronary artery disease, presenting for follow-up evaluation.  The patient presented in 2009 with an acute MI treated with bare-metal stenting of the LAD.  Other cardiovascular problems include mixed hyperlipidemia.  He has done very well since his initial event with normal LV systolic function.  The patient is here alone today. He's still working a few days per week at the [employer]. He's back to riding his bike and he has changed his diet. He's averaging 12-15 miles on his bike rides. States that he has lost 25#. Today, he denies symptoms of palpitations, chest pain, orthopnea, PND, lower extremity edema, dizziness, or syncope.  The patient has some shortness of breath when he first started his workout program, but this is improved with time.  He notes that he had an insurance physical recently and was noted to have an elevated NT-proBNP.  Otherwise he states that his labs were good.   Past Medical History:  Diagnosis Date  . Chicken pox   . Colon polyps   . Coronary artery disease   . Diverticulosis   . Emphysema of lung ([facility])   . History of shingles   . History of tobacco use   . Hyperlipidemia   . Hypertension   . Internal hemorrhoid   . Myocardial infarction [facility]) 12-08-2007   stent    Past Surgical History:  Procedure Laterality Date  . CATARACT EXTRACTION, BILATERAL  05/2016  . COLONOSCOPY  last 10/03/2015  . CORONARY STENT PLACEMENT    . KNEE ARTHROSCOPY     bilateral  . POLYPECTOMY    . VASECTOMY    . WISDOM TOOTH EXTRACTION      Current  Medications: Current Meds  Medication Sig  . aspirin 81 MG tablet Take 81 mg by mouth daily.    . Coenzyme Q10 (CO Q 10) 100 MG CAPS Take 1 capsule by mouth daily.  . nitroGLYCERIN (NITROSTAT) 0.4 MG SL tablet Place 0.4 mg under the tongue every 5 (five) minutes as needed for chest pain.   . rosuvastatin (CRESTOR) 20 MG tablet Take 1 tablet (20 mg total) by mouth daily.  . [DISCONTINUED] carvedilol (COREG) 3.125 MG tablet Take 1 tablet (3.125 mg total) by mouth 2 (two) times daily with a meal. Please keep upcoming appt in September with Dr. Burt Knack. Thank you  . [DISCONTINUED] carvedilol (COREG) 3.125 MG tablet Take 1 tablet (3.125 mg total) by mouth 2 (two) times daily with a meal.  . [DISCONTINUED] rosuvastatin (CRESTOR) 20 MG tablet Take 1 tablet (20 mg total) by mouth daily. Must keep upcoming appt for further refills     Allergies:   Patient has no known allergies.   Social History   Socioeconomic History  . Marital status: Married    Spouse name: Not on file  . Number of children: Not on file  . Years of education: 49  . Highest education level: Not on file  Occupational History  . Occupation: Surveyor, quantity: [employer].  Tobacco Use  . Smoking status: Former Smoker    Packs/day: 1.00    Years: 40.00    Pack years: 40.00    Types: Cigarettes    Quit date: 12/03/2007    Years since quitting: 12.4  . Smokeless tobacco: Never Used  Vaping Use  . Vaping Use: Never used  Substance and Sexual Activity  . Alcohol use: No  . Drug use: No  . Sexual activity: Yes  Other Topics Concern  . Not on file  Social History Narrative   Mr. Lindbloom lives with his wife and her 3 children. He works at [employer] & frequently lifts heavy objects.   Social Determinants of Health   Financial Resource Strain:   . Difficulty of Paying Living Expenses: Not on file  Food Insecurity:   . Worried About Charity fundraiser in the Last Year: Not on file  . Ran  Out of Food in the Last Year: Not on file  Transportation Needs:   . Lack of Transportation (Medical): Not on file  . Lack of Transportation (Non-Medical): Not on file  Physical Activity:   . Days of Exercise per Week: Not on file  . Minutes of Exercise per Session: Not on file  Stress:   . Feeling of Stress : Not on file  Social Connections:   . Frequency of Communication with Friends and Family: Not on file  . Frequency of Social Gatherings with Friends and Family: Not on file  . Attends Religious Services: Not on file  . Active Member of Clubs or Organizations: Not on file  . Attends Archivist Meetings: Not on file  . Marital Status: Not on file     Family History: The patient's family history includes Heart disease in his father, mother, and paternal uncle; Stroke in his mother and sister. There is no history of Colon cancer, Rectal cancer, Stomach cancer, or Esophageal cancer.  ROS:   Please see the history of present illness.    All other systems reviewed and are negative.  EKGs/Labs/Other Studies Reviewed:    EKG:  EKG is ordered today.  The ekg ordered today demonstrates sinus bradycardia 52 bpm, otherwise within normal limits  Recent Labs: No results found for requested labs within last 8760 hours.  Recent Lipid Panel    Component Value Date/Time   CHOL 141 07/04/2018 0803   TRIG 92 07/04/2018 0803   HDL 56 07/04/2018 0803   CHOLHDL 2.5 07/04/2018 0803   CHOLHDL 3.1 02/13/2016 0733   VLDL 19 02/13/2016 0733   LDLCALC 67 07/04/2018 0803    Physical Exam:    VS:  BP 130/84   Pulse (!) 52   Ht 6' (1.829 m)   Wt 175 lb 3.2 oz (79.5 kg)   SpO2 99%   BMI 23.76 kg/m     Wt Readings from Last 3 Encounters:  05/30/20 175 lb 3.2 oz (79.5 kg)  06/29/19 194 lb (88 kg)  04/03/19 192 lb (87.1 kg)     GEN:  Well nourished, well developed in no acute distress HEENT: Normal NECK: No JVD; No carotid bruits LYMPHATICS: No lymphadenopathy CARDIAC:  bradycardic and regular, no murmurs, rubs, gallops RESPIRATORY:  Clear to auscultation without rales, wheezing or rhonchi  ABDOMEN: Soft, non-tender, non-distended MUSCULOSKELETAL:  No edema; No deformity  SKIN: Warm and dry NEUROLOGIC:  Alert and oriented x 3 PSYCHIATRIC:  Normal affect   ASSESSMENT:    1. Coronary artery disease involving native coronary artery of  native heart without angina pectoris   2. Mixed hyperlipidemia   3. Atherosclerosis of native coronary artery of native heart without angina pectoris   4. Elevated brain natriuretic peptide (BNP) level   5. SOB (shortness of breath)    PLAN:    In order of problems listed above:  1. The patient appears to be doing well.  He has done a great job with diet and lifestyle modification.  He is not having any anginal symptoms. 2. Continue high intensity statin drug.  Update lipid panel and LFTs. 3. As above 4. The patient had an insurance physical which demonstrated a mildly elevated BNP level.  He does have mild exertional dyspnea.  I think we should check an echocardiogram to rule out any LV systolic or diastolic dysfunction 5. As above, normal lung exam, remote smoker  Medication Adjustments/Labs and Tests Ordered: Current medicines are reviewed at length with the patient today.  Concerns regarding medicines are outlined above.  Orders Placed This Encounter  Procedures  . Hepatic function panel  . Lipid panel  . Basic metabolic panel  . EKG 12-Lead  . ECHOCARDIOGRAM COMPLETE   Meds ordered this encounter  Medications  . DISCONTD: carvedilol (COREG) 3.125 MG tablet    Sig: Take 1 tablet (3.125 mg total) by mouth 2 (two) times daily with a meal.    Dispense:  180 tablet    Refill:  3  . rosuvastatin (CRESTOR) 20 MG tablet    Sig: Take 1 tablet (20 mg total) by mouth daily.    Dispense:  90 tablet    Refill:  3    Patient Instructions  Medication Instructions:  1) DECREASE COREG to once daily for a week then  STOP.  Lab Work: TODAY: BMET,  If you have labs (blood work) drawn today and your tests are completely normal, you will receive your results only by: Marland Kitchen MyChart Message (if you have MyChart) OR . A paper copy in the mail If you have any lab test that is abnormal or we need to change your treatment, we will call you to review the results.  Testing/Procedures: Your provider has requested that you have an echocardiogram. Echocardiography is a painless test that uses sound waves to create images of your heart. It provides your doctor with information about the size and shape of your heart and how well your heart's chambers and valves are working. This procedure takes approximately one hour. There are no restrictions for this procedure.  Follow-Up: At [facility], you and your health needs are our priority.  As part of our continuing mission to provide you with exceptional heart care, we have created designated Provider Care Teams.  These Care Teams include your primary Cardiologist (physician) and Advanced Practice Providers (APPs -  Physician Assistants and Nurse Practitioners) who all work together to provide you with the care you need, when you need it. Your next appointment:   12 month(s) The format for your next appointment:   In Person Provider:   You may see Sherren Mocha, MD or one of the following Advanced Practice Providers on your designated Care Team:    Richardson Dopp, PA-C  Robbie Lis, [place]      Signed, Sherren Mocha, MD  05/30/2020 1:21 PM    [place]

## 2020-05-30 NOTE — Patient Instructions (Addendum)
Medication Instructions:  1) DECREASE COREG to once daily for a week then STOP.  Lab Work: TODAY: BMET,  If you have labs (blood work) drawn today and your tests are completely normal, you will receive your results only by: Marland Kitchen MyChart Message (if you have MyChart) OR . A paper copy in the mail If you have any lab test that is abnormal or we need to change your treatment, we will call you to review the results.  Testing/Procedures: Your provider has requested that you have an echocardiogram. Echocardiography is a painless test that uses sound waves to create images of your heart. It provides your doctor with information about the size and shape of your heart and how well your heart's chambers and valves are working. This procedure takes approximately one hour. There are no restrictions for this procedure.  Follow-Up: At [facility], you and your health needs are our priority.  As part of our continuing mission to provide you with exceptional heart care, we have created designated Provider Care Teams.  These Care Teams include your primary Cardiologist (physician) and Advanced Practice Providers (APPs -  Physician Assistants and Nurse Practitioners) who all work together to provide you with the care you need, when you need it. Your next appointment:   12 month(s) The format for your next appointment:   In Person Provider:   You may see Sherren Mocha, MD or one of the following Advanced Practice Providers on your designated Care Team:    Richardson Dopp, PA-C  [place], [place]

## 2020-06-15 ENCOUNTER — Other Ambulatory Visit: Payer: Self-pay

## 2020-06-15 ENCOUNTER — Ambulatory Visit (HOSPITAL_COMMUNITY): Payer: Medicare Other | Attending: Cardiovascular Disease

## 2020-06-15 ENCOUNTER — Telehealth: Payer: Self-pay | Admitting: Cardiovascular Disease

## 2020-06-15 DIAGNOSIS — R0602 Shortness of breath: Secondary | ICD-10-CM | POA: Diagnosis not present

## 2020-06-15 DIAGNOSIS — I2510 Atherosclerotic heart disease of native coronary artery without angina pectoris: Secondary | ICD-10-CM | POA: Diagnosis not present

## 2020-06-15 DIAGNOSIS — R7989 Other specified abnormal findings of blood chemistry: Secondary | ICD-10-CM | POA: Diagnosis not present

## 2020-06-15 LAB — ECHOCARDIOGRAM COMPLETE
Area-P 1/2: 2.56 cm2
S' Lateral: 2.8 cm

## 2020-06-15 NOTE — Telephone Encounter (Signed)
-----   Message from Sherren Mocha, MD sent at 06/15/2020  1:38 PM EDT ----- The patient's echocardiogram looks good with normal cardiac function and mild valvular changes that are age-related.  No significant abnormalities identified.

## 2020-06-15 NOTE — Telephone Encounter (Signed)
Follow Up:     Pt is returning your call. 

## 2020-06-15 NOTE — Telephone Encounter (Signed)
Reviewed results with patient who verbalized understanding. 

## 2020-07-01 ENCOUNTER — Ambulatory Visit (INDEPENDENT_AMBULATORY_CARE_PROVIDER_SITE_OTHER): Payer: Medicare Other

## 2020-07-01 ENCOUNTER — Other Ambulatory Visit: Payer: Self-pay

## 2020-07-01 DIAGNOSIS — Z23 Encounter for immunization: Secondary | ICD-10-CM

## 2021-04-29 ENCOUNTER — Telehealth: Payer: Self-pay

## 2021-04-29 NOTE — Telephone Encounter (Signed)
Spoke with pt to sched AWV, pt declined at this time and will CB to schedule an appt.

## 2021-05-04 DIAGNOSIS — U071 COVID-19: Secondary | ICD-10-CM

## 2021-05-04 HISTORY — DX: COVID-19: U07.1

## 2021-05-25 ENCOUNTER — Telehealth: Payer: Self-pay | Admitting: Family Medicine

## 2021-05-25 ENCOUNTER — Encounter: Payer: Self-pay | Admitting: Family Medicine

## 2021-05-25 NOTE — Telephone Encounter (Signed)
Pt wife called to notify Dr. Raoul Pitch husband tested Covid+ 05/23/2021 which was 5 day of symptoms. No appt requested at this time just FYI to Dr. Raoul Pitch.

## 2021-05-25 NOTE — Telephone Encounter (Signed)
FYI

## 2021-06-20 NOTE — Progress Notes (Signed)
Cardiology Office Note:    Date:  06/21/2021   ID:  [name] [name], DOB 23-Aug-1951, MRN [id number]  PCP:  [name] Hillock, DO   [facility] Providers Cardiologist:  [name] Mocha, MD    Referring MD: [name] Hillock, DO   Chief Complaint:  Follow-up for CAD    Patient Profile:   [name] [name] is a 70 y.o. male with:  Coronary artery disease  Ant MI in 2009 s/p BMS to LAD Ischemic CM with return of normal EF  EF 4/09 (peri-MI): 35, septal and apical AK EF 6/09: 55 Echocardiogram 10/21: EF 55-60 Hyperlipidemia  Hypertension   Prior CV studies: Echocardiogram 06/15/2020 EF 55-60, no RWMA, normal RVSF, mild MR, mild thickening of AV  Echo 03/01/2008 EF 32  Echo 12/09/2007 EF 35, septal and apical AK  Cardiac catheterization 12/08/07 LM patent LAD proximal 100 Mid 50 RCA proximal 50, mid 33, R-L collaterals EF 50-55 PCI: BMS to the LAD  History of Present Illness: [name] was last seen by Dr. Burt [name] in 9/21.  He returns for follow-up.  He is here alone.  He has been doing well.  He continues to work at [employer].  He puts together all of the equipment.  He also rides about 40 miles per week on his bike.  He has not had chest pain, shortness of breath, syncope, orthopnea, leg edema.        Past Medical History:  Diagnosis Date   Chicken pox    Colon polyps    Coronary artery disease    COVID-19 05/2021   Diverticulosis    Emphysema of lung ([place])    History of shingles    History of tobacco use    Hyperlipidemia    Hypertension    Internal hemorrhoid    Myocardial infarction ([place]) 12/08/2007   stent   Current Medications: Current Meds  Medication Sig   aspirin 81 MG tablet Take 81 mg by mouth daily.     Coenzyme Q10 (CO Q 10) 100 MG CAPS Take 1 capsule by mouth daily.   nitroGLYCERIN (NITROSTAT) 0.4 MG SL tablet Place 0.4 mg under the tongue every 5 (five) minutes as needed for chest pain.    [DISCONTINUED] rosuvastatin (CRESTOR) 20 MG  tablet Take 1 tablet (20 mg total) by mouth daily.    Allergies:   Patient has no known allergies.   Social History   Tobacco Use   Smoking status: Former    Packs/day: 1.00    Years: 40.00    Pack years: 40.00    Types: Cigarettes    Quit date: 12/03/2007    Years since quitting: 13.5   Smokeless tobacco: Never  Vaping Use   Vaping Use: Never used  Substance Use Topics   Alcohol use: No   Drug use: No    Family Hx: The patient's family history includes Heart disease in his father, mother, and paternal uncle; Stroke in his mother and sister. There is no history of Colon cancer, Rectal cancer, Stomach cancer, or Esophageal cancer.  Review of Systems  Musculoskeletal:  Positive for joint pain (R shoulder; decreased ROM).    EKGs/Labs/Other Test Reviewed:    EKG:  EKG is   ordered today.  The ekg ordered today demonstrates sinus bradycardia, HR 55, normal axis, no ST-T wave changes, QTC 413  Recent Labs: No results found for requested labs within last 8760 hours.   Recent Lipid Panel Lab Results  Component Value Date/Time  CHOL 139 05/30/2020 10:17 AM   TRIG 96 05/30/2020 10:17 AM   HDL 58 05/30/2020 10:17 AM   LDLCALC 63 05/30/2020 10:17 AM     Risk Assessment/Calculations:          Physical Exam:    VS:  BP 120/80   Pulse (!) 55   Ht 6' (1.829 m)   Wt 165 lb 12.8 oz (75.2 kg)   SpO2 96%   BMI 22.49 kg/m     Wt Readings from Last 3 Encounters:  06/21/21 165 lb 12.8 oz (75.2 kg)  05/30/20 175 lb 3.2 oz (79.5 kg)  06/29/19 194 lb (88 kg)    Constitutional:      Appearance: Healthy appearance. Not in distress.  Neck:     Vascular: No carotid bruit. JVD normal.  Pulmonary:     Effort: Pulmonary effort is normal.     Breath sounds: No wheezing. No rales.  Cardiovascular:     Normal rate. Regular rhythm. Normal S1. Normal S2.      Murmurs: There is no murmur.  Edema:    Peripheral edema absent.  Abdominal:     Palpations: Abdomen is soft. There is no  hepatomegaly.  Skin:    General: Skin is warm and dry.  Neurological:     General: No focal deficit present.     Mental Status: Alert and oriented to person, place and time.     Cranial Nerves: Cranial nerves are intact.     ASSESSMENT & PLAN:   1. Coronary artery disease involving native coronary artery of native heart without angina pectoris History of anterior MI in 2090 with a bare-metal stent to the LAD.  His EF was 35% at that time but returned to normal.  Echocardiogram last year demonstrated normal EF.  He is doing well without chest pain or shortness of breath to suggest angina.  Continue aspirin 81 mg daily, rosuvastatin 20 mg daily.  Follow-up in 1 year.  2. Mixed hyperlipidemia Continue rosuvastatin 20 mg daily.  Obtain fasting lipids, LFTs today.   Dispo:  Return in about 1 year (around 06/21/2022) for Routine follow up in 1 year with Dr. Burt [name]. .   Medication Adjustments/Labs and Tests Ordered: Current medicines are reviewed at length with the patient today.  Concerns regarding medicines are outlined above.  Tests Ordered: Orders Placed This Encounter  Procedures   Comp Met (CMET)   Lipid Profile   EKG 12-Lead   Medication Changes: Meds ordered this encounter  Medications   rosuvastatin (CRESTOR) 20 MG tablet    Sig: Take 1 tablet (20 mg total) by mouth daily.    Dispense:  90 tablet    Refill:  3   Signed, [name] Dopp, PA-C  06/21/2021 10:19 AM    [address], [place], [place]  [address] Phone: [phone]; Fax: [phone]

## 2021-06-21 ENCOUNTER — Ambulatory Visit: Payer: Medicare Other | Admitting: Physician Assistant

## 2021-06-21 ENCOUNTER — Other Ambulatory Visit: Payer: Self-pay

## 2021-06-21 ENCOUNTER — Encounter: Payer: Self-pay | Admitting: Physician Assistant

## 2021-06-21 VITALS — BP 120/80 | HR 55 | Ht 72.0 in | Wt 165.8 lb

## 2021-06-21 DIAGNOSIS — I10 Essential (primary) hypertension: Secondary | ICD-10-CM | POA: Diagnosis not present

## 2021-06-21 DIAGNOSIS — I2510 Atherosclerotic heart disease of native coronary artery without angina pectoris: Secondary | ICD-10-CM

## 2021-06-21 DIAGNOSIS — E782 Mixed hyperlipidemia: Secondary | ICD-10-CM | POA: Diagnosis not present

## 2021-06-21 LAB — COMPREHENSIVE METABOLIC PANEL
ALT: 18 IU/L (ref 0–44)
AST: 19 IU/L (ref 0–40)
Albumin/Globulin Ratio: 2.1 (ref 1.2–2.2)
Albumin: 4.6 g/dL (ref 3.8–4.8)
Alkaline Phosphatase: 77 IU/L (ref 44–121)
BUN/Creatinine Ratio: 10 (ref 10–24)
BUN: 10 mg/dL (ref 8–27)
Bilirubin Total: 0.6 mg/dL (ref 0.0–1.2)
CO2: 27 mmol/L (ref 20–29)
Calcium: 9.4 mg/dL (ref 8.6–10.2)
Chloride: 101 mmol/L (ref 96–106)
Creatinine, Ser: 0.99 mg/dL (ref 0.76–1.27)
Globulin, Total: 2.2 g/dL (ref 1.5–4.5)
Glucose: 89 mg/dL (ref 70–99)
Potassium: 4.4 mmol/L (ref 3.5–5.2)
Sodium: 141 mmol/L (ref 134–144)
Total Protein: 6.8 g/dL (ref 6.0–8.5)
eGFR: 82 mL/min/{1.73_m2} (ref >59)

## 2021-06-21 LAB — LIPID PANEL
Chol/HDL Ratio: 2.5 ratio (ref 0.0–5.0)
Cholesterol, Total: 149 mg/dL (ref 100–199)
HDL: 59 mg/dL (ref >39)
LDL Chol Calc (NIH): 73 mg/dL (ref 0–99)
Triglycerides: 89 mg/dL (ref 0–149)
VLDL Cholesterol Cal: 17 mg/dL (ref 5–40)

## 2021-06-21 MED ORDER — ROSUVASTATIN CALCIUM 20 MG PO TABS
20.0000 mg | ORAL_TABLET | Freq: Every day | ORAL | 3 refills | Status: DC
Start: 2021-06-21 — End: 2021-12-14

## 2021-06-21 NOTE — Patient Instructions (Signed)
Medication Instructions:   Your physician recommends that you continue on your current medications as directed. Please refer to the Current Medication list given to you today.  *If you need a refill on your cardiac medications before your next appointment, please call your pharmacy*   Lab Work:  TODAY!!!!! CMET/LIPID  If you have labs (blood work) drawn today and your tests are completely normal, you will receive your results only by: [place] (if you have MyChart) OR A paper copy in the mail If you have any lab test that is abnormal or we need to change your treatment, we will call you to review the results.   Testing/Procedures:  -NONE   Follow-Up: At [facility], you and your health needs are our priority.  As part of our continuing mission to provide you with exceptional heart care, we have created designated Provider Care Teams.  These Care Teams include your primary Cardiologist (physician) and Advanced Practice Providers (APPs -  Physician Assistants and Nurse Practitioners) who all work together to provide you with the care you need, when you need it.  We recommend signing up for the patient portal called "MyChart".  Sign up information is provided on this After Visit Summary.  MyChart is used to connect with patients for Virtual Visits (Telemedicine).  Patients are able to view lab/test results, encounter notes, upcoming appointments, etc.  Non-urgent messages can be sent to your provider as well.   To learn more about what you can do with MyChart, go to [email or web address].    Your next appointment:   1 year(s)  The format for your next appointment:   In Person  Provider:   Sherren Mocha, MD   Other Instructions  Your physician wants you to follow-up in: 1 year with Dr.Cooper.  You will receive a reminder letter in the mail two months in advance. If you don't receive a letter, please call our office to schedule the follow-up appointment.

## 2021-06-23 ENCOUNTER — Other Ambulatory Visit: Payer: Self-pay | Admitting: *Deleted

## 2021-06-23 DIAGNOSIS — E782 Mixed hyperlipidemia: Secondary | ICD-10-CM

## 2021-06-26 DIAGNOSIS — M25511 Pain in right shoulder: Secondary | ICD-10-CM | POA: Diagnosis not present

## 2021-10-02 DIAGNOSIS — M79671 Pain in right foot: Secondary | ICD-10-CM | POA: Diagnosis not present

## 2021-10-02 DIAGNOSIS — M25511 Pain in right shoulder: Secondary | ICD-10-CM | POA: Diagnosis not present

## 2021-10-13 DIAGNOSIS — M25511 Pain in right shoulder: Secondary | ICD-10-CM | POA: Diagnosis not present

## 2021-10-18 DIAGNOSIS — M25511 Pain in right shoulder: Secondary | ICD-10-CM | POA: Diagnosis not present

## 2021-11-06 DIAGNOSIS — H40023 Open angle with borderline findings, high risk, bilateral: Secondary | ICD-10-CM | POA: Diagnosis not present

## 2021-11-06 DIAGNOSIS — H16223 Keratoconjunctivitis sicca, not specified as Sjogren's, bilateral: Secondary | ICD-10-CM | POA: Diagnosis not present

## 2021-11-13 DIAGNOSIS — M25511 Pain in right shoulder: Secondary | ICD-10-CM | POA: Diagnosis not present

## 2021-11-13 DIAGNOSIS — M25811 Other specified joint disorders, right shoulder: Secondary | ICD-10-CM | POA: Diagnosis not present

## 2021-11-27 DIAGNOSIS — M79672 Pain in left foot: Secondary | ICD-10-CM | POA: Diagnosis not present

## 2021-12-13 ENCOUNTER — Other Ambulatory Visit: Payer: Medicare Other | Admitting: *Deleted

## 2021-12-13 DIAGNOSIS — E782 Mixed hyperlipidemia: Secondary | ICD-10-CM

## 2021-12-13 LAB — LIPID PANEL
Chol/HDL Ratio: 2.1 ratio (ref 0.0–5.0)
Cholesterol, Total: 159 mg/dL (ref 100–199)
HDL: 75 mg/dL (ref >39)
LDL Chol Calc (NIH): 71 mg/dL (ref 0–99)
Triglycerides: 68 mg/dL (ref 0–149)
VLDL Cholesterol Cal: 13 mg/dL (ref 5–40)

## 2021-12-14 ENCOUNTER — Telehealth: Payer: Self-pay | Admitting: *Deleted

## 2021-12-14 DIAGNOSIS — E782 Mixed hyperlipidemia: Secondary | ICD-10-CM

## 2021-12-14 MED ORDER — ROSUVASTATIN CALCIUM 40 MG PO TABS: 40.0000 mg | ORAL_TABLET | Freq: Every day | ORAL | 3 refills | Status: DC

## 2021-12-14 NOTE — Telephone Encounter (Signed)
-----   Message from Liliane Shi, [place] sent at 12/14/2021  3:26 PM EDT ----- ?LDL remains above goal. ?PLAN:  ?-Increase Rosuvastatin to 40 mg once daily. ?-Lipids, LFTs in 3 mos.  ?Richardson Dopp, PA-C    ?12/14/2021 3:25 PM   ?

## 2021-12-29 ENCOUNTER — Encounter (HOSPITAL_COMMUNITY): Admission: RE | Admit: 2021-12-29 | Payer: Medicare Other | Source: Ambulatory Visit

## 2022-01-03 NOTE — Patient Instructions (Signed)
DUE TO COVID-19 ONLY TWO VISITORS  (aged 71 and older)  ARE ALLOWED TO COME WITH YOU AND STAY IN THE WAITING ROOM ONLY DURING PRE OP AND PROCEDURE.   ?**NO VISITORS ARE ALLOWED IN THE SHORT STAY AREA OR RECOVERY ROOM!!** ? ?IF YOU WILL BE ADMITTED INTO THE HOSPITAL YOU ARE ALLOWED ONLY FOUR SUPPORT PEOPLE DURING VISITATION HOURS ONLY (7 AM -8PM)   ?The support person(s) must pass our screening, gel in and out, and wear a mask at all times, including in the patient?s room. ?Patients must also wear a mask when staff or their support person are in the room. ?Visitors GUEST BADGE MUST BE WORN VISIBLY  ?One adult visitor may remain with you overnight and MUST be in the room by 8 P.M. ?  ? ? Your procedure is scheduled on: 01/11/22 ? ? Report to [facility] Main Entrance ? ?  Report to short stay at 5:15 AM ? ? Call this number if you have problems the morning of surgery [phone] ? ? Do not eat food :After Midnight. ? ? After Midnight you may have the following liquids until __4:30_ AM/  DAY OF SURGERY ? ?Water ?Black Coffee (sugar ok, NO MILK/CREAM OR CREAMERS)  ?Tea (sugar ok, NO MILK/CREAM OR CREAMERS) regular and decaf                             ?Plain Jell-O (NO RED)                                           ?Fruit ices (not with fruit pulp, NO RED)                                     ?Popsicles (NO RED)                                                                  ?Juice: apple, WHITE grape, WHITE cranberry ?Sports drinks like Gatorade (NO RED) ?Clear broth(vegetable,chicken,beef) ? ?              ? ?  ?  ?The day of surgery:  ?Drink ONE (1) Pre-Surgery Clear Ensure at 4:15 AM the morning of surgery. Drink in one sitting. Do not sip.  ?This drink was given to you during your hospital  ?pre-op appointment visit. ?Nothing else to drink after completing the  ?Pre-Surgery Clear Ensure at 4:30 AM ?  ?       If you have questions, please contact your surgeon?s office. ? ? ?  ?Oral Hygiene is also important  to reduce your risk of infection.                                    ?Remember - BRUSH YOUR TEETH THE MORNING OF SURGERY WITH YOUR REGULAR TOOTHPASTE ? ? ? Take these medicines the morning of surgery with A SIP OF WATER: none ? ? ?  You may not have any metal on your body including, jewelry, and body piercing ? ?           Do not wear lotions, powders, perfumes/cologne, or deodorant ? ?            Men may shave face and neck. ? ? Do not bring valuables to the hospital. [facility] NOT ?            RESPONSIBLE   FOR VALUABLES. ? ? Contacts, dentures or bridgework may not be worn into surgery. ? ?  ? Patients discharged on the day of surgery will not be allowed to drive home.  Someone NEEDS to stay with you for the first 24 hours after anesthesia. ? ? Special Instructions: Bring a copy of your healthcare power of attorney and living will documents the day of surgery if you haven't scanned them before. ? ?            Please read over the following fact sheets you were given: IF [place] [phone] ? ? ?[place]- Preparing for Total Shoulder Arthroplasty  ?  ?Before surgery, you can play an important role. Because skin is not sterile, your skin needs to be as free of germs as possible. You can reduce the number of germs on your skin by using the following products. ?Benzoyl Peroxide Gel ?Reduces the number of germs present on the skin ?Applied twice a day to shoulder area starting two days before surgery   ? ?================================================================== ? ?Please follow these instructions carefully: ? ?BENZOYL PEROXIDE 5% GEL ? ?Please do not use if you have an allergy to benzoyl peroxide.   If your skin becomes reddened/irritated stop using the benzoyl peroxide. ? ?Starting two days before surgery, apply as follows: ?Apply benzoyl peroxide in the morning and at night. Apply after taking a shower. If you are not taking a shower  clean entire shoulder front, back, and side along with the armpit with a clean wet washcloth. ? ?Place a quarter-sized dollop on your shoulder and rub in thoroughly, making sure to cover the front, back, and side of your shoulder, along with the armpit.  ? ?(5/9) 2 days before ____ AM   ____ PM             (5/10 )   1 day before ____ AM   ____ PM ?                        ?Do this twice a day for two days.  (Last application is the night before surgery, AFTER using the CHG soap as described below). ? ?Do NOT apply benzoyl peroxide gel on the day of surgery.  ? ?   [place] - Preparing for Surgery ?Before surgery, you can play an important role.  Because skin is not sterile, your skin needs to be as free of germs as possible.  You can reduce the number of germs on your skin by washing with CHG (chlorahexidine gluconate) soap before surgery.  CHG is an antiseptic cleaner which kills germs and bonds with the skin to continue killing germs even after washing. ?Please DO NOT use if you have an allergy to CHG or antibacterial soaps.  If your skin becomes reddened/irritated stop using the CHG and inform your nurse when you arrive at Short Stay.  You may shave your face/neck. ?Please follow these instructions carefully: ? 1.  Shower with CHG Soap the night before surgery and the  morning of Surgery. ? 2.  If you choose to wash your hair, wash your hair first as usual with your  normal  shampoo. ? 3.  After you shampoo, rinse your hair and body thoroughly to remove the  shampoo.                ? 4.  Use CHG as you would any other liquid soap.  You can apply chg directly  to the skin and wash  ?                     Gently with a scrungie or clean washcloth. ? 5.  Apply the CHG Soap to your body ONLY FROM THE NECK DOWN.   Do not use on face/ open      ?                     Wound or open sores. Avoid contact with eyes, ears mouth and genitals (private parts).  ?                     Production manager,  Genitals (private parts) with  your normal soap. ?            6.  Wash thoroughly, paying special attention to the area where your surgery  will be performed. ? 7.  Thoroughly rinse your body with warm water from the neck down. ? 8.  DO NOT shower/wash with your normal soap after using and rinsing off  the CHG Soap. ?            9.  Pat yourself dry with a clean towel. ?           10.  Wear clean pajamas. ?           11.  Place clean sheets on your bed the night of your first shower and do not  sleep with pets. ?Day of Surgery : ?Do not apply any lotions/deodorants the morning of surgery.  Please wear clean clothes to the hospital/surgery center. ? ?FAILURE TO FOLLOW THESE INSTRUCTIONS MAY RESULT IN THE CANCELLATION OF YOUR SURGERY ? ? ? ? ?________________________________________________________________________  ? ?Incentive Spirometer ? ?An incentive spirometer is a tool that can help keep your lungs clear and active. This tool measures how well you are filling your lungs with each breath. Taking long deep breaths may help reverse or decrease the chance of developing breathing (pulmonary) problems (especially infection) following: ?A long period of time when you are unable to move or be active. ?BEFORE THE PROCEDURE  ?If the spirometer includes an indicator to show your best effort, your nurse or respiratory therapist will set it to a desired goal. ?If possible, sit up straight or lean slightly forward. Try not to slouch. ?Hold the incentive spirometer in an upright position. ?INSTRUCTIONS FOR USE  ?Sit on the edge of your bed if possible, or sit up as far as you can in bed or on a chair. ?Hold the incentive spirometer in an upright position. ?Breathe out normally. ?Place the mouthpiece in your mouth and seal your lips tightly around it. ?Breathe in slowly and as deeply as possible, raising the piston or the ball toward the top of the column. ?Hold your breath for 3-5 seconds or for as long as possible. Allow the piston or ball to fall to the  bottom of the column. ?  Remove the mouthpiece from your mouth and breathe out normally. ?Rest for a few seconds and repeat Steps 1 through 7 at least 10 times every 1-2 hours when you are awake. Take your

## 2022-01-04 ENCOUNTER — Encounter (HOSPITAL_COMMUNITY)
Admission: RE | Admit: 2022-01-04 | Discharge: 2022-01-04 | Disposition: A | Discharge status: Home / Self Care | Payer: Medicare Other | Source: Ambulatory Visit | Attending: Orthopedic Surgery | Admitting: Orthopedic Surgery

## 2022-01-04 ENCOUNTER — Other Ambulatory Visit: Payer: Self-pay

## 2022-01-04 ENCOUNTER — Encounter (HOSPITAL_COMMUNITY): Payer: Self-pay

## 2022-01-04 VITALS — BP 132/77 | HR 75 | Temp 98.2°F | Resp 18 | Ht 71.0 in | Wt 168.0 lb

## 2022-01-04 DIAGNOSIS — Z01818 Encounter for other preprocedural examination: Secondary | ICD-10-CM

## 2022-01-04 DIAGNOSIS — Z01812 Encounter for preprocedural laboratory examination: Secondary | ICD-10-CM | POA: Insufficient documentation

## 2022-01-04 LAB — SURGICAL PCR SCREEN
MRSA, PCR: NEGATIVE
Staphylococcus aureus: NEGATIVE

## 2022-01-04 LAB — BASIC METABOLIC PANEL
Anion gap: 8 (ref 5–15)
BUN: 16 mg/dL (ref 8–23)
CO2: 31 mmol/L (ref 22–32)
Calcium: 9.6 mg/dL (ref 8.9–10.3)
Chloride: 100 mmol/L (ref 98–111)
Creatinine, Ser: 1.03 mg/dL (ref 0.61–1.24)
GFR, Estimated: >60 mL/min (ref >60)
Glucose, Bld: 89 mg/dL (ref 70–99)
Potassium: 4.4 mmol/L (ref 3.5–5.1)
Sodium: 139 mmol/L (ref 135–145)

## 2022-01-04 LAB — CBC
HCT: 48.4 % (ref 39.0–52.0)
Hemoglobin: 16 g/dL (ref 13.0–17.0)
MCH: 32.3 pg (ref 26.0–34.0)
MCHC: 33.1 g/dL (ref 30.0–36.0)
MCV: 97.8 fL (ref 80.0–100.0)
Platelets: 213 10*3/uL (ref 150–400)
RBC: 4.95 MIL/uL (ref 4.22–5.81)
RDW: 13.5 % (ref 11.5–15.5)
WBC: 7 10*3/uL (ref 4.0–10.5)
nRBC: 0 % (ref 0.0–0.2)

## 2022-01-04 NOTE — Progress Notes (Signed)
Anesthesia note: ? ?Bowel prep reminder:NA ? ?PCP - Howard Pouch DO ?Cardiologist -Dr. Ezzie Dural ?Other-  ? ?Chest x-ray - no ?EKG - 06/21/21-epic ?Stress Test - no ?ECHO - 06/15/20-epic ?Cardiac Cath - 2009 ? ?Pacemaker/ICD device last checked:NA ? ?Sleep Study - no ?CPAP -  ? ?Pt is pre diabetic-NA ?Fasting Blood Sugar -  ?Checks Blood Sugar _____ ? ?Blood Thinner:ASA 81/ Dr. Burt Knack ?Blood Thinner Instructions: ?Aspirin Instructions:none ?Last Dose: ? ?Anesthesia review: yes ? ?Patient denies shortness of breath, fever, cough and chest pain at PAT appointment ?Pt has no SOB climbing stairs doing housework or with ADLs.  ? ?Patient verbalized understanding of instructions that were given to them at the PAT appointment. Patient was also instructed that they will need to review over the PAT instructions again at home before surgery. yes ?

## 2022-01-10 NOTE — Anesthesia Preprocedure Evaluation (Addendum)
Anesthesia Evaluation  ?Patient identified by MRN, date of birth, ID band ?Patient awake ? ? ? ?Reviewed: ?Allergy & Precautions, NPO status , Patient's Chart, lab work & pertinent test results ? ?Airway ?Mallampati: II ? ?TM Distance: >3 FB ?Neck ROM: Full ? ? ? Dental ?no notable dental hx. ? ?  ?Pulmonary ?COPD, former smoker,  ?  ?Pulmonary exam normal ?breath sounds clear to auscultation ? ? ? ? ? ? Cardiovascular ?hypertension, + CAD, + Past MI and + Cardiac Stents  ?Normal cardiovascular exam ?Rhythm:Regular Rate:Normal ? ?EKG 06/2021: SB rate 55 ?  ?Neuro/Psych ?negative neurological ROS ? negative psych ROS  ? GI/Hepatic ?negative GI ROS, Neg liver ROS,   ?Endo/Other  ?negative endocrine ROShyperlipidemia ? Renal/GU ?negative Renal ROS  ?negative genitourinary ?  ?Musculoskeletal ? ?(+) Arthritis , Osteoarthritis,   ? Abdominal ?  ?Peds ?negative pediatric ROS ?(+)  Hematology ?negative hematology ROS ?(+)   ?Anesthesia Other Findings ? ? Reproductive/Obstetrics ?negative OB ROS ? ?  ? ? ? ? ? ? ? ? ? ? ? ? ? ?  ?  ? ? ? ? ? ? ? ?Anesthesia Physical ?Anesthesia Plan ? ?ASA: 3 ? ?Anesthesia Plan: General and Regional  ? ?Post-op Pain Management: Regional block* and Tylenol PO (pre-op)*  ? ?Induction: Intravenous ? ?PONV Risk Score and Plan: 2 and Treatment may vary due to age or medical condition, Midazolam, Dexamethasone and Ondansetron ? ?Airway Management Planned: Oral ETT ? ?Additional Equipment:  ? ?Intra-op Plan:  ? ?Post-operative Plan: Extubation in OR ? ?Informed Consent: I have reviewed the patients History and Physical, chart, labs and discussed the procedure including the risks, benefits and alternatives for the proposed anesthesia with the patient or authorized representative who has indicated his/her understanding and acceptance.  ? ? ? ?Dental advisory given ? ?Plan Discussed with: CRNA, Surgeon and Anesthesiologist ? ?Anesthesia Plan Comments: (Interscalene  block with exparel. GETA. Norton Blizzard, MD  ?)  ? ? ? ? ? ?Anesthesia Quick Evaluation ? ?

## 2022-01-11 ENCOUNTER — Other Ambulatory Visit: Payer: Self-pay

## 2022-01-11 ENCOUNTER — Encounter (HOSPITAL_COMMUNITY)
Admission: RE | Disposition: A | Discharge status: Home / Self Care | Payer: Self-pay | Source: Ambulatory Visit | Attending: Orthopedic Surgery

## 2022-01-11 ENCOUNTER — Ambulatory Visit (HOSPITAL_COMMUNITY): Payer: Medicare Other | Admitting: Anesthesiology

## 2022-01-11 ENCOUNTER — Encounter (HOSPITAL_COMMUNITY): Payer: Self-pay | Admitting: Orthopedic Surgery

## 2022-01-11 ENCOUNTER — Ambulatory Visit (HOSPITAL_COMMUNITY)
Admission: RE | Admit: 2022-01-11 | Discharge: 2022-01-11 | Disposition: A | Discharge status: Home / Self Care | Payer: Medicare Other | Source: Ambulatory Visit | Attending: Orthopedic Surgery | Admitting: Orthopedic Surgery

## 2022-01-11 ENCOUNTER — Ambulatory Visit (HOSPITAL_BASED_OUTPATIENT_CLINIC_OR_DEPARTMENT_OTHER): Payer: Medicare Other | Admitting: Anesthesiology

## 2022-01-11 DIAGNOSIS — I10 Essential (primary) hypertension: Secondary | ICD-10-CM | POA: Insufficient documentation

## 2022-01-11 DIAGNOSIS — M75101 Unspecified rotator cuff tear or rupture of right shoulder, not specified as traumatic: Secondary | ICD-10-CM | POA: Insufficient documentation

## 2022-01-11 DIAGNOSIS — I2510 Atherosclerotic heart disease of native coronary artery without angina pectoris: Secondary | ICD-10-CM

## 2022-01-11 DIAGNOSIS — M25811 Other specified joint disorders, right shoulder: Secondary | ICD-10-CM | POA: Diagnosis not present

## 2022-01-11 DIAGNOSIS — G8918 Other acute postprocedural pain: Secondary | ICD-10-CM | POA: Diagnosis not present

## 2022-01-11 DIAGNOSIS — I252 Old myocardial infarction: Secondary | ICD-10-CM | POA: Diagnosis not present

## 2022-01-11 DIAGNOSIS — Z87891 Personal history of nicotine dependence: Secondary | ICD-10-CM

## 2022-01-11 DIAGNOSIS — M12811 Other specific arthropathies, not elsewhere classified, right shoulder: Secondary | ICD-10-CM | POA: Diagnosis not present

## 2022-01-11 HISTORY — PX: REVERSE SHOULDER ARTHROPLASTY: SHX5054

## 2022-01-11 SURGERY — ARTHROPLASTY, SHOULDER, TOTAL, REVERSE
Anesthesia: Regional | Site: Shoulder | Laterality: Right

## 2022-01-11 MED ORDER — VANCOMYCIN HCL 1000 MG IV SOLR
INTRAVENOUS | Status: AC
  Filled 2022-01-11: qty 20

## 2022-01-11 MED ORDER — ONDANSETRON HCL 4 MG PO TABS: 4.0000 mg | ORAL_TABLET | Freq: Four times a day (QID) | ORAL | Status: DC | PRN

## 2022-01-11 MED ORDER — SUGAMMADEX SODIUM 200 MG/2ML IV SOLN
INTRAVENOUS | Status: DC | PRN
  Administered 2022-01-11: 150 mg via INTRAVENOUS

## 2022-01-11 MED ORDER — ROCURONIUM BROMIDE 10 MG/ML (PF) SYRINGE
PREFILLED_SYRINGE | INTRAVENOUS | Status: DC | PRN
  Administered 2022-01-11: 70 mg via INTRAVENOUS

## 2022-01-11 MED ORDER — ONDANSETRON HCL 4 MG PO TABS: 4.0000 mg | ORAL_TABLET | Freq: Three times a day (TID) | ORAL | 0 refills | Status: DC | PRN

## 2022-01-11 MED ORDER — AMISULPRIDE (ANTIEMETIC) 5 MG/2ML IV SOLN: 10.0000 mg | Freq: Once | INTRAVENOUS | Status: DC | PRN

## 2022-01-11 MED ORDER — DEXAMETHASONE SODIUM PHOSPHATE 10 MG/ML IJ SOLN
INTRAMUSCULAR | Status: AC
  Filled 2022-01-11: qty 1

## 2022-01-11 MED ORDER — LACTATED RINGERS IV SOLN: INTRAVENOUS | Status: DC

## 2022-01-11 MED ORDER — PANTOPRAZOLE SODIUM 40 MG PO TBEC: 40.0000 mg | DELAYED_RELEASE_TABLET | Freq: Every day | ORAL | Status: DC

## 2022-01-11 MED ORDER — BUPIVACAINE HCL (PF) 0.5 % IJ SOLN
INTRAMUSCULAR | Status: DC | PRN
  Administered 2022-01-11: 15 mL via PERINEURAL

## 2022-01-11 MED ORDER — EPHEDRINE 5 MG/ML INJ
INTRAVENOUS | Status: AC
  Filled 2022-01-11: qty 5

## 2022-01-11 MED ORDER — CEFAZOLIN SODIUM-DEXTROSE 2-4 GM/100ML-% IV SOLN
2.0000 g | INTRAVENOUS | Status: AC
  Administered 2022-01-11: 2 g via INTRAVENOUS
  Filled 2022-01-11: qty 100

## 2022-01-11 MED ORDER — ACETAMINOPHEN 500 MG PO TABS
1000.0000 mg | ORAL_TABLET | Freq: Once | ORAL | Status: AC
  Administered 2022-01-11: 1000 mg via ORAL
  Filled 2022-01-11: qty 2

## 2022-01-11 MED ORDER — 0.9 % SODIUM CHLORIDE (POUR BTL) OPTIME
TOPICAL | Status: DC | PRN
  Administered 2022-01-11: 1000 mL

## 2022-01-11 MED ORDER — BUPIVACAINE LIPOSOME 1.3 % IJ SUSP
INTRAMUSCULAR | Status: DC | PRN
  Administered 2022-01-11: 10 mL via PERINEURAL

## 2022-01-11 MED ORDER — METOCLOPRAMIDE HCL 5 MG PO TABS: 5.0000 mg | ORAL_TABLET | Freq: Three times a day (TID) | ORAL | Status: DC | PRN

## 2022-01-11 MED ORDER — LACTATED RINGERS IV BOLUS
250.0000 mL | Freq: Once | INTRAVENOUS | Status: AC
  Administered 2022-01-11: 250 mL via INTRAVENOUS

## 2022-01-11 MED ORDER — MIDAZOLAM HCL 5 MG/5ML IJ SOLN
INTRAMUSCULAR | Status: DC | PRN
  Administered 2022-01-11: 2 mg via INTRAVENOUS

## 2022-01-11 MED ORDER — LIDOCAINE 2% (20 MG/ML) 5 ML SYRINGE
INTRAMUSCULAR | Status: DC | PRN
  Administered 2022-01-11: 80 mg via INTRAVENOUS

## 2022-01-11 MED ORDER — OXYCODONE-ACETAMINOPHEN 5-325 MG PO TABS: 1.0000 | ORAL_TABLET | ORAL | 0 refills | Status: DC | PRN

## 2022-01-11 MED ORDER — DIPHENHYDRAMINE HCL 12.5 MG/5ML PO ELIX: 12.5000 mg | ORAL_SOLUTION | ORAL | Status: DC | PRN

## 2022-01-11 MED ORDER — TRANEXAMIC ACID 1000 MG/10ML IV SOLN: 1000.0000 mg | INTRAVENOUS | Status: DC

## 2022-01-11 MED ORDER — STERILE WATER FOR IRRIGATION IR SOLN
Status: DC | PRN
  Administered 2022-01-11: 2000 mL

## 2022-01-11 MED ORDER — BISACODYL 5 MG PO TBEC: 5.0000 mg | DELAYED_RELEASE_TABLET | Freq: Every day | ORAL | Status: DC | PRN

## 2022-01-11 MED ORDER — ONDANSETRON HCL 4 MG/2ML IJ SOLN
INTRAMUSCULAR | Status: DC | PRN
  Administered 2022-01-11: 4 mg via INTRAVENOUS

## 2022-01-11 MED ORDER — METOCLOPRAMIDE HCL 5 MG/ML IJ SOLN: 5.0000 mg | Freq: Three times a day (TID) | INTRAMUSCULAR | Status: DC | PRN

## 2022-01-11 MED ORDER — PROPOFOL 10 MG/ML IV BOLUS
INTRAVENOUS | Status: DC | PRN
  Administered 2022-01-11: 160 mg via INTRAVENOUS

## 2022-01-11 MED ORDER — DEXAMETHASONE SODIUM PHOSPHATE 10 MG/ML IJ SOLN
INTRAMUSCULAR | Status: DC | PRN
  Administered 2022-01-11: 10 mg via INTRAVENOUS

## 2022-01-11 MED ORDER — FENTANYL CITRATE (PF) 250 MCG/5ML IJ SOLN
INTRAMUSCULAR | Status: DC | PRN
  Administered 2022-01-11 (×2): 50 ug via INTRAVENOUS

## 2022-01-11 MED ORDER — ONDANSETRON HCL 4 MG/2ML IJ SOLN: 4.0000 mg | Freq: Once | INTRAMUSCULAR | Status: DC | PRN

## 2022-01-11 MED ORDER — MIDAZOLAM HCL 2 MG/2ML IJ SOLN
INTRAMUSCULAR | Status: AC
  Filled 2022-01-11: qty 2

## 2022-01-11 MED ORDER — PHENYLEPHRINE HCL-NACL 20-0.9 MG/250ML-% IV SOLN
INTRAVENOUS | Status: AC
  Filled 2022-01-11: qty 250

## 2022-01-11 MED ORDER — FENTANYL CITRATE PF 50 MCG/ML IJ SOSY: 25.0000 ug | PREFILLED_SYRINGE | INTRAMUSCULAR | Status: DC | PRN

## 2022-01-11 MED ORDER — EPHEDRINE SULFATE-NACL 50-0.9 MG/10ML-% IV SOSY
PREFILLED_SYRINGE | INTRAVENOUS | Status: DC | PRN
  Administered 2022-01-11: 5 mg via INTRAVENOUS
  Administered 2022-01-11: 10 mg via INTRAVENOUS
  Administered 2022-01-11 (×2): 5 mg via INTRAVENOUS

## 2022-01-11 MED ORDER — PHENOL 1.4 % MT LIQD: 1.0000 | OROMUCOSAL | Status: DC | PRN

## 2022-01-11 MED ORDER — ROCURONIUM BROMIDE 10 MG/ML (PF) SYRINGE
PREFILLED_SYRINGE | INTRAVENOUS | Status: AC
  Filled 2022-01-11: qty 10

## 2022-01-11 MED ORDER — NAPROXEN 500 MG PO TABS: 500.0000 mg | ORAL_TABLET | Freq: Two times a day (BID) | ORAL | 1 refills | Status: DC

## 2022-01-11 MED ORDER — OXYCODONE HCL 5 MG/5ML PO SOLN: 5.0000 mg | Freq: Once | ORAL | Status: DC | PRN

## 2022-01-11 MED ORDER — OXYCODONE HCL 5 MG PO TABS: 5.0000 mg | ORAL_TABLET | Freq: Once | ORAL | Status: DC | PRN

## 2022-01-11 MED ORDER — CYCLOBENZAPRINE HCL 10 MG PO TABS: 10.0000 mg | ORAL_TABLET | Freq: Three times a day (TID) | ORAL | 1 refills | Status: DC | PRN

## 2022-01-11 MED ORDER — ALUM & MAG HYDROXIDE-SIMETH 200-200-20 MG/5ML PO SUSP: 30.0000 mL | ORAL | Status: DC | PRN

## 2022-01-11 MED ORDER — TRANEXAMIC ACID-NACL 1000-0.7 MG/100ML-% IV SOLN
1000.0000 mg | INTRAVENOUS | Status: AC
  Administered 2022-01-11: 1000 mg via INTRAVENOUS
  Filled 2022-01-11: qty 100

## 2022-01-11 MED ORDER — ONDANSETRON HCL 4 MG/2ML IJ SOLN
INTRAMUSCULAR | Status: AC
  Filled 2022-01-11: qty 2

## 2022-01-11 MED ORDER — FENTANYL CITRATE (PF) 100 MCG/2ML IJ SOLN
INTRAMUSCULAR | Status: AC
Start: 2022-01-11 — End: ?
  Filled 2022-01-11: qty 2

## 2022-01-11 MED ORDER — VANCOMYCIN HCL 1000 MG IV SOLR
INTRAVENOUS | Status: DC | PRN
  Administered 2022-01-11: 1000 mg

## 2022-01-11 MED ORDER — PROPOFOL 10 MG/ML IV BOLUS
INTRAVENOUS | Status: AC
  Filled 2022-01-11: qty 20

## 2022-01-11 MED ORDER — LACTATED RINGERS IV BOLUS
500.0000 mL | Freq: Once | INTRAVENOUS | Status: AC
  Administered 2022-01-11: 500 mL via INTRAVENOUS

## 2022-01-11 MED ORDER — LACTATED RINGERS IV SOLN
INTRAVENOUS | Status: DC
Start: 2022-01-11 — End: 2022-01-11

## 2022-01-11 MED ORDER — POLYETHYLENE GLYCOL 3350 17 G PO PACK: 17.0000 g | PACK | Freq: Every day | ORAL | Status: DC | PRN

## 2022-01-11 MED ORDER — MENTHOL 3 MG MT LOZG: 1.0000 | LOZENGE | OROMUCOSAL | Status: DC | PRN

## 2022-01-11 MED ORDER — DOCUSATE SODIUM 100 MG PO CAPS: 100.0000 mg | ORAL_CAPSULE | Freq: Two times a day (BID) | ORAL | Status: DC

## 2022-01-11 MED ORDER — LIDOCAINE HCL (PF) 2 % IJ SOLN
INTRAMUSCULAR | Status: AC
  Filled 2022-01-11: qty 5

## 2022-01-11 MED ORDER — ONDANSETRON HCL 4 MG/2ML IJ SOLN: 4.0000 mg | Freq: Four times a day (QID) | INTRAMUSCULAR | Status: DC | PRN

## 2022-01-11 SURGICAL SUPPLY — 75 items
BAG COUNTER SPONGE SURGICOUNT (BAG) IMPLANT
BAG ZIPLOCK 12X15 (MISCELLANEOUS) ×2 IMPLANT
BLADE SAW SGTL 83.5X18.5 (BLADE) ×2 IMPLANT
BNDG COHESIVE 4X5 TAN ST LF (GAUZE/BANDAGES/DRESSINGS) ×2 IMPLANT
COOLER ICEMAN CLASSIC (MISCELLANEOUS) ×2 IMPLANT
COVER BACK TABLE 60X90IN (DRAPES) ×2 IMPLANT
COVER SURGICAL LIGHT HANDLE (MISCELLANEOUS) ×2 IMPLANT
CUP SUT UNIV REVERS 39 NEU (Shoulder) ×1 IMPLANT
DERMABOND ADVANCED (GAUZE/BANDAGES/DRESSINGS) ×1
DERMABOND ADVANCED .7 DNX12 (GAUZE/BANDAGES/DRESSINGS) ×1 IMPLANT
DRAPE INCISE IOBAN 66X45 STRL (DRAPES) IMPLANT
DRAPE ORTHO SPLIT 77X108 STRL (DRAPES) ×2
DRAPE SHEET LG 3/4 BI-LAMINATE (DRAPES) ×2 IMPLANT
DRAPE SURG 17X11 SM STRL (DRAPES) ×2 IMPLANT
DRAPE SURG ORHT 6 SPLT 77X108 (DRAPES) ×2 IMPLANT
DRAPE TOP 10253 STERILE (DRAPES) ×2 IMPLANT
DRAPE U-SHAPE 47X51 STRL (DRAPES) ×2 IMPLANT
DRESSING AQUACEL AG SP 3.5X10 (GAUZE/BANDAGES/DRESSINGS) IMPLANT
DRESSING AQUACEL AG SP 3.5X6 (GAUZE/BANDAGES/DRESSINGS) ×1 IMPLANT
DRSG AQUACEL AG ADV 3.5X10 (GAUZE/BANDAGES/DRESSINGS) IMPLANT
DRSG AQUACEL AG SP 3.5X10 (GAUZE/BANDAGES/DRESSINGS) ×2
DRSG AQUACEL AG SP 3.5X6 (GAUZE/BANDAGES/DRESSINGS) ×2
DRSG TEGADERM 8X12 (GAUZE/BANDAGES/DRESSINGS) ×2 IMPLANT
DURAPREP 26ML APPLICATOR (WOUND CARE) ×2 IMPLANT
ELECT BLADE TIP CTD 4 INCH (ELECTRODE) ×2 IMPLANT
ELECT PENCIL ROCKER SW 15FT (MISCELLANEOUS) ×2 IMPLANT
ELECT REM PT RETURN 15FT ADLT (MISCELLANEOUS) ×2 IMPLANT
FACESHIELD WRAPAROUND (MASK) ×8 IMPLANT
FACESHIELD WRAPAROUND OR TEAM (MASK) ×4 IMPLANT
GLENOID UNI REV MOD 24 +2 LAT (Joint) ×1 IMPLANT
GLENOSPHERE 39+4 LAT/24 UNI RV (Joint) ×1 IMPLANT
GLOVE BIO SURGEON STRL SZ7.5 (GLOVE) ×2 IMPLANT
GLOVE BIO SURGEON STRL SZ8 (GLOVE) ×2 IMPLANT
GLOVE SS BIOGEL STRL SZ 7 (GLOVE) ×1 IMPLANT
GLOVE SS BIOGEL STRL SZ 7.5 (GLOVE) ×1 IMPLANT
GLOVE SUPERSENSE BIOGEL SZ 7 (GLOVE) ×1
GLOVE SUPERSENSE BIOGEL SZ 7.5 (GLOVE) ×1
GOWN STRL REIN XL XLG (GOWN DISPOSABLE) ×4 IMPLANT
INSERT HUMERAL 39/+6 (Insert) ×1 IMPLANT
KIT BASIN OR (CUSTOM PROCEDURE TRAY) ×2 IMPLANT
KIT TURNOVER KIT A (KITS) IMPLANT
MANIFOLD NEPTUNE II (INSTRUMENTS) ×2 IMPLANT
NDL TAPERED W/ NITINOL LOOP (MISCELLANEOUS) ×1 IMPLANT
NEEDLE TAPERED W/ NITINOL LOOP (MISCELLANEOUS) ×2 IMPLANT
NS IRRIG 1000ML POUR BTL (IV SOLUTION) ×2 IMPLANT
PACK SHOULDER (CUSTOM PROCEDURE TRAY) ×2 IMPLANT
PAD ARMBOARD 7.5X6 YLW CONV (MISCELLANEOUS) ×2 IMPLANT
PAD COLD SHLDR WRAP-ON (PAD) ×2 IMPLANT
PIN NITINOL TARGETER 2.8 (PIN) IMPLANT
PIN SET MODULAR GLENOID SYSTEM (PIN) ×1 IMPLANT
RESTRAINT HEAD UNIVERSAL NS (MISCELLANEOUS) ×2 IMPLANT
SCREW CENTRAL MOD 35 (Screw) ×1 IMPLANT
SCREW PERI LOCK 5.5X24 (Screw) ×1 IMPLANT
SCREW PERI LOCK 5.5X32 (Screw) ×2 IMPLANT
SCREW PERIPHERAL 5.5X28 LOCK (Screw) ×1 IMPLANT
SLING ARM FOAM STRAP LRG (SOFTGOODS) IMPLANT
SLING ARM FOAM STRAP MED (SOFTGOODS) IMPLANT
SLING ARM IMMOBILIZER LRG (SOFTGOODS) ×1 IMPLANT
SPONGE T-LAP 18X18 ~~LOC~~+RFID (SPONGE) IMPLANT
SPONGE T-LAP 4X18 ~~LOC~~+RFID (SPONGE) ×2 IMPLANT
STEM HUMERAL UNI REV SIZE 12 (Stem) ×1 IMPLANT
SUCTION FRAZIER HANDLE 12FR (TUBING) ×1
SUCTION TUBE FRAZIER 12FR DISP (TUBING) ×1 IMPLANT
SUT FIBERWIRE #2 38 T-5 BLUE (SUTURE)
SUT MNCRL AB 3-0 PS2 18 (SUTURE) ×2 IMPLANT
SUT MON AB 2-0 CT1 36 (SUTURE) ×2 IMPLANT
SUT VIC AB 1 CT1 36 (SUTURE) ×2 IMPLANT
SUTURE FIBERWR #2 38 T-5 BLUE (SUTURE) IMPLANT
SUTURE TAPE 1.3 40 TPR END (SUTURE) ×2 IMPLANT
SUTURETAPE 1.3 40 TPR END (SUTURE) ×4
TOWEL OR 17X26 10 PK STRL BLUE (TOWEL DISPOSABLE) ×2 IMPLANT
TOWEL OR NON WOVEN STRL DISP B (DISPOSABLE) ×2 IMPLANT
TUBE SUCTION HIGH CAP CLEAR NV (SUCTIONS) ×2 IMPLANT
WATER STERILE IRR 1000ML POUR (IV SOLUTION) ×4 IMPLANT
YANKAUER SUCT BULB TIP 10FT TU (MISCELLANEOUS) IMPLANT

## 2022-01-11 NOTE — Anesthesia Procedure Notes (Signed)
Procedure Name: Intubation ?Date/Time: 01/11/2022 7:41 AM ?Performed by: Kalif Kattner D, CRNA ?Pre-anesthesia Checklist: Patient identified, Emergency Drugs available, Suction available and Patient being monitored ?Patient Re-evaluated:Patient Re-evaluated prior to induction ?Oxygen Delivery Method: Circle system utilized ?Preoxygenation: Pre-oxygenation with 100% oxygen ?Induction Type: IV induction ?Ventilation: Mask ventilation without difficulty ?Laryngoscope Size: Mac and 4 ?Grade View: Grade I ?Tube type: Oral ?Number of attempts: 1 ?Airway Equipment and Method: Stylet ?Placement Confirmation: ETT inserted through vocal cords under direct vision, positive ETCO2 and breath sounds checked- equal and bilateral ?Tube secured with: Tape ?Dental Injury: Teeth and Oropharynx as per pre-operative assessment  ? ? ? ? ?

## 2022-01-11 NOTE — Evaluation (Signed)
Occupational Therapy Evaluation ?Patient Details ?Name: [name] [name] ?MRN: [id number] ?DOB: 12-04-1950 ?Today's Date: 01/11/2022 ? ? ?History of Present Illness Patient is a 71 year old male s/p R reverse total shoulder arthroplasty. PMH: COVID, shingles,  ? ?Clinical Impression ?  ?Patient is a 71 year old male s/p shoulder replacement without functional use of right dominant upper extremity secondary to effects of surgery and interscalene block and shoulder precautions. Therapist provided education and instruction to patient  in regards to exercises, precautions, positioning, donning upper extremity clothing and bathing while maintaining shoulder precautions, ice and edema management and donning/doffing sling. Patient verbalized understanding and demonstrated as needed. Patient needed assistance to donn shirt, underwear, pants, socks and shoes and provided with instruction on compensatory strategies to perform ADLs. Patient to follow up with MD for further therapy needs.  ?  ?   ? ?Recommendations for follow up therapy are one component of a multi-disciplinary discharge planning process, led by the attending physician.  Recommendations may be updated based on patient status, additional functional criteria and insurance authorization.  ? ?Follow Up Recommendations ? Follow physician's recommendations for discharge plan and follow up therapies  ?  ?Assistance Recommended at Discharge PRN  ?Patient can return home with the following A little help with bathing/dressing/bathroom ? ?  ?Functional Status Assessment ? Patient has had a recent decline in their functional status and demonstrates the ability to make significant improvements in function in a reasonable and predictable amount of time.  ?Equipment Recommendations ? None recommended by OT  ?  ?   ?Precautions / Restrictions Precautions ?Precautions: Shoulder ?Type of Shoulder Precautions: If sitting in controlled environment, ok to come out of sling to give  neck a break. Please sleep in it to protect until follow up in office.  OK to use operative arm for feeding, hygiene and ADLs.Ok to instruct Pendulums and lap slides as exercises. Ok to use operative arm within the following parameters for ADL purposes Ok for PROM, AAROM, AROM within pain tolerance and within the following ROM   ER 20   ABD 45   FE 60 ?Shoulder Interventions: Shoulder sling/immobilizer;Off for dressing/bathing/exercises ?Precaution Booklet Issued: Yes (comment) ?Required Braces or Orthoses: Sling ?Restrictions ?Weight Bearing Restrictions: Yes ?RUE Weight Bearing: Non weight bearing  ? ?  ? ?Mobility Bed Mobility ?  ?  ?  ?  ?  ?  ?  ?  ?  ? ?Transfers ?Overall transfer level: Independent ?  ?  ?  ?  ?  ?  ?  ?  ?  ?  ? ?  ?Balance Overall balance assessment: Independent ?  ?  ?  ?  ?  ?  ?  ?  ?  ?  ?  ?  ?  ?  ?  ?  ?  ?  ?   ? ?ADL either performed or assessed with clinical judgement  ? ?ADL Overall ADL's : Needs assistance/impaired ?Eating/Feeding: Independent ?  ?Grooming: Independent ?  ?Upper Body Bathing: Minimal assistance;Sitting;Standing ?  ?Lower Body Bathing: Minimal assistance;Sit to/from stand ?  ?Upper Body Dressing : Minimal assistance;Standing ?Upper Body Dressing Details (indicate cue type and reason): Assist to thread R UE into shirt ?Lower Body Dressing: Minimal assistance;Sit to/from stand ?Lower Body Dressing Details (indicate cue type and reason): Assist to pull up pants over socks ?Toilet Transfer: Independent ?  ?Toileting- Clothing Manipulation and Hygiene: Independent ?  ?  ?  ?Functional mobility during ADLs: Independent ?General ADL Comments:  Patient educated in shoulder precautions and how to maintain during self care tasks.  ? ? ? ? ?Pertinent Vitals/Pain Pain Assessment ?Pain Assessment: Faces ?Faces Pain Scale: Hurts a little bit ?Pain Location: R UE ?Pain Descriptors / Indicators: Numbness, Heaviness ?Pain Intervention(s): Monitored during session  ? ? ? ?Hand  Dominance Right ?  ?Extremity/Trunk Assessment Upper Extremity Assessment ?Upper Extremity Assessment: RUE deficits/detail ?RUE Deficits / Details: + nerve block ?  ?Lower Extremity Assessment ?Lower Extremity Assessment: Overall WFL for tasks assessed ?  ?Cervical / Trunk Assessment ?Cervical / Trunk Assessment: Normal ?  ?Communication Communication ?Communication: No difficulties ?  ?Cognition Arousal/Alertness: Awake/alert ?Behavior During Therapy: [facility] for tasks assessed/performed ?Overall Cognitive Status: Within Functional Limits for tasks assessed ?  ?  ?  ?  ?  ?  ?  ?  ?  ?  ?  ?  ?  ?  ?  ?  ?  ?  ?  ?   ?Exercises Exercises: Shoulder ?  ?Shoulder Instructions Shoulder Instructions ?Donning/doffing shirt without moving shoulder: Minimal assistance;Patient able to independently direct caregiver ?Method for sponge bathing under operated UE: Minimal assistance;Patient able to independently direct caregiver ?Donning/doffing sling/immobilizer: Maximal assistance;Patient able to independently direct caregiver ?Correct positioning of sling/immobilizer: Minimal assistance;Patient able to independently direct caregiver ?Pendulum exercises (written home exercise program): Patient able to independently direct caregiver ?ROM for elbow, wrist and digits of operated UE: Patient able to independently direct caregiver ?Sling wearing schedule (on at all times/off for ADL's): Patient able to independently direct caregiver ?Proper positioning of operated UE when showering: Patient able to independently direct caregiver ?Positioning of UE while sleeping: Patient able to independently direct caregiver  ? ? ?Home Living Family/patient expects to be discharged to:: Private residence ?Living Arrangements: Spouse/significant other ?  ?  ?  ?  ?  ?  ?  ?  ?  ?  ?  ?  ?  ?  ?  ?  ?  ? ?  ?Prior Functioning/Environment Prior Level of Function : Independent/Modified Independent ?  ?  ?  ?  ?  ?  ?  ?  ?  ? ?  ?  ?OT Problem List:  Pain;Impaired UE functional use;Decreased knowledge of precautions ?  ?   ?   ?OT Goals(Current goals can be found in the care plan section) Acute Rehab OT Goals ?Patient Stated Goal: Home with spouse ?OT Goal Formulation: All assessment and education complete, DC therapy  ? ?AM-PAC OT "6 Clicks" Daily Activity     ?Outcome Measure Help from another person eating meals?: None ?Help from another person taking care of personal grooming?: None ?Help from another person toileting, which includes using toliet, bedpan, or urinal?: None ?Help from another person bathing (including washing, rinsing, drying)?: A Little ?Help from another person to put on and taking off regular upper body clothing?: A Little ?Help from another person to put on and taking off regular lower body clothing?: A Little ?6 Click Score: 21 ?  ?End of Session Equipment Utilized During Treatment: Other (comment) (sling) ?Nurse Communication: Other (comment) (OT complete) ? ?Activity Tolerance: Patient tolerated treatment well ?Patient left: in chair;with call bell/phone within reach;with nursing/sitter in room ? ?OT Visit Diagnosis: Pain ?Pain - Right/Left: Right ?Pain - part of body: Shoulder  ?              ?Time: 1829-9371 ?OT Time Calculation (min): 23 min ?Charges:  OT General Charges ?$OT Visit: 1 Visit ?OT Evaluation ?$  OT Eval Low Complexity: 1 Low ?OT Treatments ?$Self Care/Home Management : 8-22 mins ? ?Delbert Phenix OT ?OT pager: [phone] ? ? ?Rosemary Holms ?01/11/2022, 11:12 AM ?

## 2022-01-11 NOTE — Transfer of Care (Signed)
Immediate Anesthesia Transfer of Care Note ? ?Patient: [name] [name] ? ?Procedure(s) Performed: REVERSE SHOULDER ARTHROPLASTY (Right: Shoulder) ? ?Patient Location: PACU ? ?Anesthesia Type:General ? ?Level of Consciousness: awake, alert  and oriented ? ?Airway & Oxygen Therapy: Patient Spontanous Breathing and Patient connected to face mask oxygen ? ?Post-op Assessment: Report given to RN and Post -op Vital signs reviewed and stable ? ?Post vital signs: Reviewed and stable ? ?Last Vitals:  ?Vitals Value Taken Time  ?BP 145/79 01/11/22 0915  ?Temp    ?Pulse 62 01/11/22 0916  ?Resp 15 01/11/22 0916  ?SpO2 100 % 01/11/22 0916  ?Vitals shown include unvalidated device data. ? ?Last Pain:  ?Vitals:  ? 01/11/22 0547  ?PainSc: 5   ?   ? ?Patients Stated Pain Goal: 5 (01/11/22 0547) ? ?Complications: No notable events documented. ?

## 2022-01-11 NOTE — H&P (Signed)
[name] [name]   ? ?Chief Complaint: Right shoulder rotator cuff tear arthropathy ?HPI: The patient is a 71 y.o. male with chronic and progressively increasing right shoulder pain related to severe rotator cuff tear arthropathy.  Due to his increasing functional limitations and failure to respond to prolonged attempts at conservative management, he is brought to the operating room at this time for planned right shoulder reverse arthroplasty. ? ?Past Medical History:  ?Diagnosis Date  ? Chicken pox   ? Colon polyps   ? Coronary artery disease   ? COVID-19 05/2021  ? Emphysema of lung ([place])   ? History of shingles   ? History of tobacco use   ? Hyperlipidemia   ? Hypertension   ? Myocardial infarction ([place]) 12/08/2007  ? stent  ? ? ? ? ?Past Surgical History:  ?Procedure Laterality Date  ? CATARACT EXTRACTION, BILATERAL  05/2016  ? COLONOSCOPY  last 10/03/2015  ? CORONARY STENT PLACEMENT  2009  ? KNEE ARTHROSCOPY Left 1997  ? KNEE ARTHROSCOPY Right 2005  ? VASECTOMY  1980  ? [place] EXTRACTION  1980  ? ? ?Family History  ?Problem Relation Age of Onset  ? Heart disease Father   ? Heart disease Paternal Uncle   ? Heart disease Mother   ? Stroke Mother   ? Stroke Sister   ? Colon cancer Neg Hx   ? Rectal cancer Neg Hx   ? Stomach cancer Neg Hx   ? Esophageal cancer Neg Hx   ? ? ?Social History:  reports that he quit smoking about 14 years ago. His smoking use included cigarettes. He has a 40.00 pack-year smoking history. He has never used smokeless tobacco. He reports that he does not drink alcohol and does not use drugs. ? ?BMI: ?Estimated body mass index is 23.43 kg/m? as calculated from the following: ?  Height as of this encounter: '5\' 11"'$  (1.803 m). ?  Weight as of this encounter: 76.2 kg. ? ?Lab Results  ?Component Value Date  ? ALBUMIN 4.6 06/21/2021  ? ?Diabetes: ?Patient does not have a diagnosis of diabetes. ?  ?  ?Smoking Status: ?Social History  ? ?Tobacco Use  ?Smoking Status Former  ? Packs/day: 1.00   ? Years: 40.00  ? Pack years: 40.00  ? Types: Cigarettes  ? Quit date: 12/03/2007  ? Years since quitting: 14.1  ?Smokeless Tobacco Never  ? ?The patient is not currently a tobacco user. ?Counseling given: Not Answered   ?  ? ?Medications Prior to Admission  ?Medication Sig Dispense Refill  ? aspirin 81 MG tablet Take 81 mg by mouth daily.      ? Coenzyme Q10 (CO Q 10) 100 MG CAPS Take 100 mg by mouth daily.    ? rosuvastatin (CRESTOR) 40 MG tablet Take 1 tablet (40 mg total) by mouth daily. 90 tablet 3  ? nitroGLYCERIN (NITROSTAT) 0.4 MG SL tablet Place 0.4 mg under the tongue every 5 (five) minutes as needed for chest pain.     ? ? ? ?Physical Exam: Right shoulder demonstrates painful and guarded motion as noted at recent office visits.  Globally decreased strength.  Neurovascular intact in the right upper extremity. ? ?Plain radiographs confirm changes consistent with chronic rotator cuff tear arthropathy. ? ?Vitals ? ?Temp:  [98.5 ?F (36.9 ?C)] 98.5 ?F (36.9 ?C) (05/11 0534) ?Pulse Rate:  [70] 70 (05/11 0534) ?Resp:  [18] 18 (05/11 0534) ?BP: (144)/(88) 144/88 (05/11 0534) ?SpO2:  [98 %] 98 % (05/11  2330) ?Weight:  [76.2 kg] 76.2 kg (05/11 0547) ? ?Assessment/Plan ? ?Impression: Right shoulder rotator cuff tear arthropathy ? ?Plan of Action: Procedure(s): ?REVERSE SHOULDER ARTHROPLASTY ? ?[name] [name] ?01/11/2022, 5:51 AM ?Contact # [phone] ? ? ? ? ? ?  ?

## 2022-01-11 NOTE — Op Note (Signed)
01/11/2022 ? ?8:58 AM ? ?PATIENT:   [name] [name]  71 y.o. male ? ?PRE-OPERATIVE DIAGNOSIS:  Right shoulder rotator cuff tear arthropathy ? ?POST-OPERATIVE DIAGNOSIS: Same ? ?PROCEDURE: Right shoulder reverse arthroplasty utilizing a press-fit size 12 Arthrex stem with a neutral metaphysis, +6 polyethylene insert, 39/+4 glenosphere and a small/+2 baseplate ? ?SURGEON:  Marin Shutter M.D. ? ?ASSISTANTS: Jenetta Loges, PA-C ? ?ANESTHESIA:   General endotracheal and interscalene block with Exparel ? ?EBL: 100 cc ? ?SPECIMEN: None ? ?Drains: None ? ? ?PATIENT DISPOSITION:  PACU - hemodynamically stable. ? ? ? ?PLAN OF CARE: Discharge to home after PACU ? ?Brief history: ? ?Patient is a 71 year old male with chronic and progressively increasing right shoulder pain and associated functional limitations with failure to respond to prolonged attempts at conservative management.  He is brought to the operating room at this time for planned right shoulder reverse arthroplasty. ? ?Preoperatively, I counseled the patient regarding treatment options and risks versus benefits thereof.  Possible surgical complications were all reviewed including potential for bleeding, infection, neurovascular injury, persistent pain, loss of motion, anesthetic complication, failure of the implant, and possible need for additional surgery. They understand and accept and agrees with our planned procedure. ? ? ?Procedure in detail: ? ?After undergoing routine preop evaluation the patient received prophylactic antibiotics and interscalene block with Exparel was established in the holding area by the anesthesia department.  Subsequently placed spine on the operating table and underwent the smooth induction of a general endotracheal anesthesia.  Placed in the beachchair position and appropriately padded and protected.  The right shoulder girdle region was sterilely prepped and draped in standard fashion.  Timeout was called.  A deltopectoral  approach to the right shoulder was made through an approximately 8 cm incision.  Skin flaps were elevated dissection carried deeply the deltopectoral interval was then developed from proximal to distal with the vein taken laterally.  The long head biceps tendon was then tenodesed to the upper border the pectoralis major tendon with the proximal segment unroofed and excised.  The subscapularis was then separated from the lesser tuberosity using electrocautery with a free margin tagged with a pair of grasping suture tape sutures.  Capsular attachments were then divided from the anterior and inferior margins of the humeral neck and humeral head was then delivered through the wound.  The humeral head was completely denuded of rotator cuff insertion superiorly.  An extra medullary guide was then used to outline a proposed humeral head resection which was performed with an oscillating saw at approximate 20 degrees retroversion.  A metal cap was then placed over the cut proximal humeral surface and the glenoid was then exposed with appropriate retractors.  A circumferential labral resection was then completed gaining complete visualization of the periphery of the glenoid.  A guidepin was then directed into the center of the glenoid and preparation with the central and peripheral reamers to a stable subchondral bony bed.  Preparation good with the central drill and tapped for a 35 mm lag screw.  Baseplate was then assembled and inserted with vancomycin powder applied to the threads of the lag screw with excellent purchase and fixation achieved.  All the peripheral locking screws were then placed using standard technique with excellent purchase and fixation.  A 39/+4 glenosphere was then impacted onto the baseplate and the central locking screw was placed.  Attention then returned to the proximal humerus where the canal was opened and we broached up to a size  12 at approximately 20 degrees of retroversion.  A neutral  metaphyseal reaming guide was then used to prepare the metaphysis.  Trial reduction showed good motion good stability and good soft tissue balance.  Our trial was then removed the final implant was then assembled.  The canal was irrigated cleaned and dried and vancomycin powder spread into the humeral canal.  The implant was then seated with excellent fit and fixation.  Trial reductions were then performed with finding of the appropriate soft tissue balance with a +6 poly much to our satisfaction.  The trial poly was then removed the implant was cleaned and dried the final poly was then impacted and final reduction was performed again showing excellent motion stability and soft tissue balance all much to our satisfaction.  Good elasticity the subscapularis was then confirmed it was repaired back to the eyelets on the Coley implant using the previously placed suture tape sutures.  Final irrigation completed.  Hemostasis was obtained.  The balance of vancomycin powder was then spread liberally throughout the deep soft tissue planes.  The deltopectoral interval was reapproximated with a series of figure-of-eight number Vicryl sutures.  2-0 Monocryl used to close the subcu layer and intracuticular 3-0 Monocryl for the skin followed by Dermabond and Aquacel dressing.  Right arm placed into a sling the patient was awakened, extubated, and taken to the recovery room in stable condition. ? ?Jenetta Loges, PA-C was utilized as an Environmental consultant throughout this case, essential for help with positioning the patient, positioning extremity, tissue manipulation, implantation of the prosthesis, suture management, wound closure, and intraoperative decision-making. ? ?Marin Shutter MD ? ? ?Contact # [phone] ? ? ? ? ?

## 2022-01-11 NOTE — Anesthesia Postprocedure Evaluation (Signed)
Anesthesia Post Note ? ?Patient: [name] [name] ? ?Procedure(s) Performed: REVERSE SHOULDER ARTHROPLASTY (Right: Shoulder) ? ?  ? ?Patient location during evaluation: PACU ?Anesthesia Type: Regional and General ?Level of consciousness: awake and alert ?Pain management: pain level controlled ?Vital Signs Assessment: post-procedure vital signs reviewed and stable ?Respiratory status: spontaneous breathing, nonlabored ventilation and respiratory function stable ?Cardiovascular status: blood pressure returned to baseline and stable ?Postop Assessment: no apparent nausea or vomiting ?Anesthetic complications: no ? ? ?No notable events documented. ? ?Last Vitals:  ?Vitals:  ? 01/11/22 0956 01/11/22 1006  ?BP: (!) 142/83 (!) 143/87  ?Pulse: 66 66  ?Resp: 15 16  ?Temp: 36.4 ?C 36.4 ?C  ?SpO2: 96% 96%  ?  ?Last Pain:  ?Vitals:  ? 01/11/22 1006  ?PainSc: 0-No pain  ? ? ?  ?  ?  ?  ?  ?  ? ?[name] ? ? ? ? ?

## 2022-01-11 NOTE — Discharge Instructions (Signed)
? ?[name] [name], M.D., F.A.A.O.S. ?Orthopaedic Surgery ?Specializing in Arthroscopic and Reconstructive ?Surgery of the Shoulder ?[phone] ?[address] - [place], [place] [address] - Fax [phone] ? ? ?POST-OP TOTAL SHOULDER REPLACEMENT INSTRUCTIONS ? ?1. Follow up in the office for your first post-op appointment 10-14 days from the date of your surgery. If you do not already have a scheduled appointment, our office will contact you to schedule. ? ?2. The bandage over your incision is waterproof. You may begin showering with this dressing on. You may leave this dressing on until first follow up appointment within 2 weeks. We prefer you leave this dressing in place until follow up however after 5-7 days if you are having itching or skin irritation and would like to remove it you may do so. Go slow and tug at the borders gently to break the bond the dressing has with the skin. At this point if there is no drainage it is okay to go without a bandage or you may cover it with a light guaze and tape. You can also expect significant bruising around your shoulder that will drift down your arm and into your chest wall. This is very normal and should resolve over several days. ? ? 3. Wear your sling/immobilizer at all times except to perform the exercises below or to occasionally let your arm dangle by your side to stretch your elbow. You also need to sleep in your sling immobilizer until instructed otherwise. It is ok to remove your sling if you are sitting in a controlled environment and allow your arm to rest in a position of comfort by your side or on your lap with pillows to give your neck and skin a break from the sling. You may remove it to allow arm to dangle by side to shower. If you are up walking around and when you go to sleep at night you need to wear it. ? ?4. Range of motion to your elbow, wrist, and hand are encouraged 3-5 times daily. Exercise to your hand and fingers helps to reduce  swelling you may experience. ? ? ?5. Prescriptions for a pain medication and a muscle relaxant are provided for you. It is recommended that if you are experiencing pain that you pain medication alone is not controlling, add the muscle relaxant along with the pain medication which can give additional pain relief. The first 1-2 days is generally the most severe of your pain and then should gradually decrease. As your pain lessens it is recommended that you decrease your use of the pain medications to an "as needed basis'" only and to always comply with the recommended dosages of the pain medications. ? ?6. Pain medications can produce constipation along with their use. If you experience this, the use of an over the counter stool softener or laxative daily is recommended.  ? ?7. For additional questions or concerns, please do not hesitate to call the office. If after hours there is an answering service to forward your concerns to the physician on call. ? ?8.Pain control following an exparel block ? ?To help control your post-operative pain you received a nerve block  performed with Exparel which is a long acting anesthetic (numbing agent) which can provide pain relief and sensations of numbness (and relief of pain) in the operative shoulder and arm for up to 3 days. Sometimes it provides mixed relief, meaning you may still have numbness in certain areas of the arm but can still be able to   move  parts of that arm, hand, and fingers. We recommend that your prescribed pain medications  be used as needed. We do not feel it is necessary to "pre medicate" and "stay ahead" of pain.  Taking narcotic pain medications when you are not having any pain can lead to unnecessary and potentially dangerous side effects.   ? ?9. Use the ice machine as much as possible in the first 5-7 days from surgery, then you can wean its use to as needed. The ice typically needs to be replaced every 6 hours, instead of ice you can actually freeze  water bottles to put in the cooler and then fill water around them to avoid having to purchase ice. You can have spare water bottles freezing to allow you to rotate them once they have melted. Try to have a thin shirt or light cloth or towel under the ice wrap to protect your skin.  ? ?FOR ADDITIONAL INFO ON ICE MACHINE AND INSTRUCTIONS GO TO THE WEBSITE AT ? ?https://www.djogl[employer].com/products/donjoy/donjoy-iceman-classic3 ? ?10.  We recommend that you avoid any dental work or cleaning in the first 3 months following your joint replacement. This is to help minimize the possibility of infection from the bacteria in your mouth that enters your bloodstream during dental work. We also recommend that you take an antibiotic prior to your dental work for the first year after your shoulder replacement to further help reduce that risk. Please simply contact our office for antibiotics to be sent to your pharmacy prior to dental work. ? ?11. Dental Antibiotics: ? ?We recommend waiting at least 3 months for any dental work even cleanings unless there is a dental emergency. We also recommend  prophylactic antibiotics for all dental procdeures  the first year following your joint replacement. In some exceptions we recommend them to be used lifelong. We will provide you with that prescription in follow up office visits, or you can call our office. ? ?Exceptions are as follows: ? ?1. History of prior total joint infection ? ?2. Severely immunocompromised (Organ Transplant, cancer chemotherapy, Rheumatoid biologic ?meds such as Humera) ? ?3. Poorly controlled diabetes (A1C &gt; 8.0, blood glucose over 200) ? ? ?POST-OP EXERCISES ? ?Pendulum Exercises ? ?Perform pendulum exercises while standing and bending at the waist. Support your uninvolved arm on a table or chair and allow your operated arm to hang freely. Make sure to do these exercises passively - not using you shoulder muscles. These exercises can be performed once your  nerve block effects have worn off. ? ?Repeat 20 times. Do 3 sessions per day. ? ? ?  ?

## 2022-01-11 NOTE — Anesthesia Procedure Notes (Signed)
Anesthesia Regional Block: Interscalene brachial plexus block  ? ?Pre-Anesthetic Checklist: , timeout performed,  Correct Patient, Correct Site, Correct Laterality,  Correct Procedure, Correct Position, site marked,  Risks and benefits discussed,  Surgical consent,  Pre-op evaluation,  At surgeon's request and post-op pain management ? ?Laterality: Right ? ?Prep: chloraprep     ?  ?Needles:  ?Injection technique: Single-shot ? ?Needle Type: Echogenic Stimulator Needle   ? ? ?Needle Length: 10cm  ?Needle Gauge: 20  ? ? ? ?Additional Needles: ? ? ?Procedures:,,,, ultrasound used (permanent image in chart),,    ?Narrative:  ?Start time: 01/11/2022 6:55 AM ?End time: 01/11/2022 7:00 AM ?Injection made incrementally with aspirations every 5 mL. ? ?Performed by: Personally  ?Anesthesiologist: [name], MD ? ?Additional Notes: ?Standard monitors applied. Skin prepped. Good needle visualization with ultrasound. Injection made in 5cc increments with no resistance to injection. Patient tolerated the procedure well. ? ? ? ? ?

## 2022-01-12 ENCOUNTER — Encounter (HOSPITAL_COMMUNITY): Payer: Self-pay | Admitting: Orthopedic Surgery

## 2022-01-14 NOTE — Progress Notes (Signed)
Preoperative testing ?

## 2022-01-22 DIAGNOSIS — Z5189 Encounter for other specified aftercare: Secondary | ICD-10-CM | POA: Diagnosis not present

## 2022-01-30 ENCOUNTER — Telehealth: Payer: Self-pay

## 2022-01-30 DIAGNOSIS — M25511 Pain in right shoulder: Secondary | ICD-10-CM | POA: Diagnosis not present

## 2022-01-30 NOTE — Telephone Encounter (Signed)
-----   Message from [facility], [place] sent at 01/24/2022  8:23 AM EDT ----- Pt hasn't been seen in almost 3 years please assist pt with scheduling appt.

## 2022-01-30 NOTE — Telephone Encounter (Signed)
Called pt to assist with scheduling

## 2022-02-08 DIAGNOSIS — M25511 Pain in right shoulder: Secondary | ICD-10-CM | POA: Diagnosis not present

## 2022-02-13 DIAGNOSIS — M25511 Pain in right shoulder: Secondary | ICD-10-CM | POA: Diagnosis not present

## 2022-02-20 DIAGNOSIS — M25511 Pain in right shoulder: Secondary | ICD-10-CM | POA: Diagnosis not present

## 2022-02-27 DIAGNOSIS — M25511 Pain in right shoulder: Secondary | ICD-10-CM | POA: Diagnosis not present

## 2022-03-01 DIAGNOSIS — M25511 Pain in right shoulder: Secondary | ICD-10-CM | POA: Diagnosis not present

## 2022-03-08 DIAGNOSIS — M25511 Pain in right shoulder: Secondary | ICD-10-CM | POA: Diagnosis not present

## 2022-03-13 DIAGNOSIS — M25511 Pain in right shoulder: Secondary | ICD-10-CM | POA: Diagnosis not present

## 2022-03-15 DIAGNOSIS — M25511 Pain in right shoulder: Secondary | ICD-10-CM | POA: Diagnosis not present

## 2022-03-16 ENCOUNTER — Other Ambulatory Visit: Payer: Medicare Other

## 2022-03-16 DIAGNOSIS — E782 Mixed hyperlipidemia: Secondary | ICD-10-CM

## 2022-03-16 LAB — LIPID PANEL
Chol/HDL Ratio: 2.3 ratio (ref 0.0–5.0)
Cholesterol, Total: 136 mg/dL (ref 100–199)
HDL: 60 mg/dL (ref >39)
LDL Chol Calc (NIH): 63 mg/dL (ref 0–99)
Triglycerides: 59 mg/dL (ref 0–149)
VLDL Cholesterol Cal: 13 mg/dL (ref 5–40)

## 2022-03-16 LAB — HEPATIC FUNCTION PANEL
ALT: 21 IU/L (ref 0–44)
AST: 18 IU/L (ref 0–40)
Albumin: 4.2 g/dL (ref 3.9–4.9)
Alkaline Phosphatase: 71 IU/L (ref 44–121)
Bilirubin Total: 0.4 mg/dL (ref 0.0–1.2)
Bilirubin, Direct: 0.16 mg/dL (ref 0.00–0.40)
Total Protein: 6.1 g/dL (ref 6.0–8.5)

## 2022-03-20 DIAGNOSIS — M25511 Pain in right shoulder: Secondary | ICD-10-CM | POA: Diagnosis not present

## 2022-03-22 DIAGNOSIS — M25511 Pain in right shoulder: Secondary | ICD-10-CM | POA: Diagnosis not present

## 2022-03-27 DIAGNOSIS — M25511 Pain in right shoulder: Secondary | ICD-10-CM | POA: Diagnosis not present

## 2022-03-28 NOTE — Progress Notes (Signed)
Pt has been made aware of normal result and verbalized understanding.  jw

## 2022-03-30 DIAGNOSIS — M25511 Pain in right shoulder: Secondary | ICD-10-CM | POA: Diagnosis not present

## 2022-04-02 DIAGNOSIS — Z471 Aftercare following joint replacement surgery: Secondary | ICD-10-CM | POA: Diagnosis not present

## 2022-04-02 DIAGNOSIS — Z96611 Presence of right artificial shoulder joint: Secondary | ICD-10-CM | POA: Diagnosis not present

## 2022-05-23 ENCOUNTER — Encounter: Payer: Self-pay | Admitting: Family Medicine

## 2022-05-23 ENCOUNTER — Ambulatory Visit (INDEPENDENT_AMBULATORY_CARE_PROVIDER_SITE_OTHER): Payer: Medicare Other | Admitting: Family Medicine

## 2022-05-23 VITALS — BP 128/78 | HR 66 | Temp 97.6°F | Ht 71.26 in | Wt 170.2 lb

## 2022-05-23 DIAGNOSIS — Z23 Encounter for immunization: Secondary | ICD-10-CM

## 2022-05-23 DIAGNOSIS — M545 Low back pain, unspecified: Secondary | ICD-10-CM | POA: Diagnosis not present

## 2022-05-23 DIAGNOSIS — R109 Unspecified abdominal pain: Secondary | ICD-10-CM | POA: Diagnosis not present

## 2022-05-23 MED ORDER — NAPROXEN 500 MG PO TABS: 500.0000 mg | ORAL_TABLET | Freq: Two times a day (BID) | ORAL | 0 refills | Status: DC

## 2022-05-23 MED ORDER — CYCLOBENZAPRINE HCL 10 MG PO TABS: 10.0000 mg | ORAL_TABLET | Freq: Two times a day (BID) | ORAL | 0 refills | Status: DC

## 2022-05-23 MED ORDER — ROSUVASTATIN CALCIUM 40 MG PO TABS: 40.0000 mg | ORAL_TABLET | Freq: Every day | ORAL | 3 refills | Status: DC

## 2022-05-23 NOTE — Progress Notes (Signed)
[name] [name] , [birth year], 71 y.o., male MRN: [id number] Patient Care Team    Relationship Specialty Notifications Start End  Ma Hillock, DO PCP - General Family Medicine  05/23/22   Sherren Mocha, MD PCP - Cardiology Cardiology Admissions 03/25/18   Doran Stabler, MD Consulting Physician Gastroenterology  02/09/19     Chief Complaint  Patient presents with   Back Pain    Started about 3 weeks ago. Crestor Rx expired but is taking     Subjective: Pt presents for an OV with complaints of back pain for about 3-6 weeks. He denies known injury or over activity prior to onset. He reports the pain felt "deep" on the left lower side of his back. Nothing particular makes it worse. Pt has tried tylenol to ease their symptoms. He reports he felt it helped for a few days, then pain returned. He denies fever, chills, bowel change or urinary changes. He denies radiation of pain to legs or buttocks.      05/23/2022    1:49 PM 02/09/2019    9:03 AM 05/09/2018    1:29 PM 05/27/2017   10:19 AM  Depression screen PHQ 2/9  Decreased Interest 0 0 0 0  Down, Depressed, Hopeless 0 0 0 0  PHQ - 2 Score 0 0 0 0    No Known Allergies Social History   Social History Narrative   Mr. Wurzer lives with his wife and her 3 children. He works at [employer] & frequently lifts heavy objects.   Past Medical History:  Diagnosis Date   Chicken pox    Colon polyps    Coronary artery disease    COVID-19 05/2021   Emphysema of lung ([place])    History of shingles    History of tobacco use    Hyperlipidemia    Hypertension    Myocardial infarction ([place]) 12/08/2007   stent   Past Surgical History:  Procedure Laterality Date   CATARACT EXTRACTION, BILATERAL  05/2016   COLONOSCOPY  last 10/03/2015   CORONARY STENT PLACEMENT  2009   KNEE ARTHROSCOPY Left 1997   KNEE ARTHROSCOPY Right 2005   REVERSE SHOULDER ARTHROPLASTY Right 01/11/2022   Procedure: REVERSE SHOULDER ARTHROPLASTY;   Surgeon: Justice Britain, MD;  Location: [facility];  Service: Orthopedics;  Laterality: Right;  168mn   VASECTOMY  1980   WISDOM TOOTH EXTRACTION  1980   Family History  Problem Relation Age of Onset   Heart disease Father    Heart disease Paternal Uncle    Heart disease Mother    Stroke Mother    Stroke Sister    Colon cancer Neg Hx    Rectal cancer Neg Hx    Stomach cancer Neg Hx    Esophageal cancer Neg Hx    Allergies as of 05/23/2022   No Known Allergies      Medication List        Accurate as of May 23, 2022  2:43 PM. If you have any questions, ask your nurse or doctor.          STOP taking these medications    ondansetron 4 MG tablet Commonly known as: Zofran Stopped by: RHoward Pouch DO   oxyCODONE-acetaminophen 5-325 MG tablet Commonly known as: Percocet Stopped by: RHoward Pouch DO       TAKE these medications    aspirin 81 MG tablet Take 81 mg by mouth daily.   Co Q 10 100  MG Caps Take 100 mg by mouth daily.   cyclobenzaprine 10 MG tablet Commonly known as: FLEXERIL Take 1 tablet (10 mg total) by mouth in the morning and at bedtime. What changed:  when to take this reasons to take this Changed by: Howard Pouch, DO   naproxen 500 MG tablet Commonly known as: Naprosyn Take 1 tablet (500 mg total) by mouth 2 (two) times daily with a meal.   nitroGLYCERIN 0.4 MG SL tablet Commonly known as: NITROSTAT Place 0.4 mg under the tongue every 5 (five) minutes as needed for chest pain.   rosuvastatin 40 MG tablet Commonly known as: CRESTOR Take 1 tablet (40 mg total) by mouth daily.        All past medical history, surgical history, allergies, family history, immunizations andmedications were updated in the EMR today and reviewed under the history and medication portions of their EMR.     ROS Negative, with the exception of above mentioned in HPI   Objective:  BP 128/78   Pulse 66   Temp 97.6 F (36.4 C)   Ht 5' 11.26" (1.81 m)    Wt 170 lb 3.2 oz (77.2 kg)   SpO2 95%   BMI 23.57 kg/m  Body mass index is 23.57 kg/m. Physical Exam Vitals and nursing note reviewed.  Constitutional:      General: He is not in acute distress.    Appearance: Normal appearance. He is not ill-appearing, toxic-appearing or diaphoretic.  HENT:     Head: Normocephalic and atraumatic.  Eyes:     General: No scleral icterus.       Right eye: No discharge.        Left eye: No discharge.     Extraocular Movements: Extraocular movements intact.     Pupils: Pupils are equal, round, and reactive to light.  Abdominal:     Tenderness: There is no abdominal tenderness. There is no right CVA tenderness, left CVA tenderness, guarding or rebound.  Musculoskeletal:        General: Swelling and tenderness present.     Lumbar back: Spasms and tenderness present. No bony tenderness.       Back:     Right lower leg: No edema.     Left lower leg: No edema.  Skin:    Coloration: Skin is not pale.     Findings: No rash.  Neurological:     Mental Status: He is alert and oriented to person, place, and time. Mental status is at baseline.  Psychiatric:        Mood and Affect: Mood normal.        Behavior: Behavior normal.        Thought Content: Thought content normal.        Judgment: Judgment normal.      No results found. No results found. No results found for this or any previous visit (from the past 24 hour(s)).  Assessment/Plan: [name] [name] is a 71 y.o. male present for OV for  Need for immunization against influenza - Flu Vaccine QUAD High Dose(Fluad)  Left flank pain/lumbar pain Pt is concerned pain was kidney related. Will ensure kidney fx is stable and no abnormality of his urine.  Spasm of Left Quadratus m. on exam.  Flexeril and naproxen scheduled x5 days, them prn.  Heat application- stretches.  - Basic Metabolic Panel (BMET) - Urinalysis, Routine w reflex microscopic    Reviewed expectations re: course of current  medical issues. Discussed self-management of symptoms.  Outlined signs and symptoms indicating need for more acute intervention. Patient verbalized understanding and all questions were answered. Patient received an After-Visit Summary.    Orders Placed This Encounter  Procedures   Flu Vaccine QUAD High Dose(Fluad)   Basic Metabolic Panel (BMET)   Urinalysis, Routine w reflex microscopic   Meds ordered this encounter  Medications   rosuvastatin (CRESTOR) 40 MG tablet    Sig: Take 1 tablet (40 mg total) by mouth daily.    Dispense:  90 tablet    Refill:  3   naproxen (NAPROSYN) 500 MG tablet    Sig: Take 1 tablet (500 mg total) by mouth 2 (two) times daily with a meal.    Dispense:  60 tablet    Refill:  0   cyclobenzaprine (FLEXERIL) 10 MG tablet    Sig: Take 1 tablet (10 mg total) by mouth in the morning and at bedtime.    Dispense:  60 tablet    Refill:  0   Referral Orders  No referral(s) requested today     Note is dictated utilizing voice recognition software. Although note has been proof read prior to signing, occasional typographical errors still can be missed. If any questions arise, please do not hesitate to call for verification.   electronically signed by:  Howard Pouch, DO  [place]

## 2022-05-23 NOTE — Patient Instructions (Addendum)
Return in about 24 weeks (around 11/07/2022) for cpe (20 min).        Great to see you today.  I have refilled the medication(s) we provide.   If labs were collected, we will inform you of lab results once received either by echart message or telephone call.   - echart message- for normal results that have been seen by the patient already.   - telephone call: abnormal results or if patient has not viewed results in their echart.

## 2022-05-24 LAB — BASIC METABOLIC PANEL
BUN: 12 mg/dL (ref 7–25)
CO2: 30 mmol/L (ref 20–32)
Calcium: 9.8 mg/dL (ref 8.6–10.3)
Chloride: 103 mmol/L (ref 98–110)
Creat: 0.98 mg/dL (ref 0.70–1.28)
Glucose, Bld: 112 mg/dL — ABNORMAL HIGH (ref 65–99)
Potassium: 4.5 mmol/L (ref 3.5–5.3)
Sodium: 142 mmol/L (ref 135–146)

## 2022-05-25 LAB — URINALYSIS, ROUTINE W REFLEX MICROSCOPIC
Bilirubin Urine: NEGATIVE
Glucose, UA: NEGATIVE
Hgb urine dipstick: NEGATIVE
Ketones, ur: NEGATIVE
Leukocytes,Ua: NEGATIVE
Nitrite: NEGATIVE
Protein, ur: NEGATIVE
Specific Gravity, Urine: 1.017 (ref 1.001–1.035)
pH: 7 (ref 5.0–8.0)

## 2022-05-25 LAB — URINE CULTURE
MICRO NUMBER:: 13944436
Result:: NO GROWTH
SPECIMEN QUALITY:: ADEQUATE

## 2022-06-05 ENCOUNTER — Encounter: Payer: Self-pay | Admitting: Cardiovascular Disease

## 2022-06-05 ENCOUNTER — Ambulatory Visit: Payer: Medicare Other | Attending: Cardiovascular Disease | Admitting: Cardiovascular Disease

## 2022-06-05 VITALS — BP 130/80 | HR 69 | Ht 71.26 in | Wt 170.0 lb

## 2022-06-05 DIAGNOSIS — I2510 Atherosclerotic heart disease of native coronary artery without angina pectoris: Secondary | ICD-10-CM | POA: Diagnosis not present

## 2022-06-05 DIAGNOSIS — E782 Mixed hyperlipidemia: Secondary | ICD-10-CM | POA: Diagnosis not present

## 2022-06-05 NOTE — Patient Instructions (Signed)
Medication Instructions:  Your physician recommends that you continue on your current medications as directed. Please refer to the Current Medication list given to you today.  *If you need a refill on your cardiac medications before your next appointment, please call your pharmacy*   Lab Work: NONE If you have labs (blood work) drawn today and your tests are completely normal, you will receive your results only by: MyChart Message (if you have MyChart) OR A paper copy in the mail If you have any lab test that is abnormal or we need to change your treatment, we will call you to review the results.   Testing/Procedures: NONE  Follow-Up: At [facility], you and your health needs are our priority.  As part of our continuing mission to provide you with exceptional heart care, we have created designated Provider Care Teams.  These Care Teams include your primary Cardiologist (physician) and Advanced Practice Providers (APPs -  Physician Assistants and Nurse Practitioners) who all work together to provide you with the care you need, when you need it.  Your next appointment:   1 year(s)  The format for your next appointment:   In Person  Provider:   Michael Cooper, MD       Important Information About Sugar       

## 2022-06-05 NOTE — Progress Notes (Unsigned)
Cardiology Office Note:    Date:  06/06/2022   ID:  [name] [name], DOB 10-10-1950, MRN [id number]  PCP:  [name] Hillock, DO   [facility] Cardiologist:  [name] Mocha, MD     Referring MD: [name] Hillock, DO   Chief Complaint  Patient presents with   Annual Exam    History of Present Illness:    [name] [name] is a 71 y.o. male with a hx of coronary artery disease, presenting for follow-up evaluation.  The patient presented in 2009 with an acute MI treated with bare-metal stenting of the LAD.  Other cardiovascular problems include mixed hyperlipidemia.  He has done very well since his initial event with normal LV systolic function.  The patient is here alone today.  He has been doing very well from a cardiac perspective.  He has been compliant with his statin drug and baby aspirin.  He remains physically active with riding his bicycle on a regular basis with no exertional symptoms.  He denies chest pain, chest pressure, shortness of breath, or heart palpitations.  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  Past Medical History:  Diagnosis Date   Chicken pox    Colon polyps    Coronary artery disease    COVID-19 05/2021   Emphysema of lung ([place])    History of shingles    History of tobacco use    Hyperlipidemia    Hypertension    Myocardial infarction ([place]) 12/08/2007   stent    Past Surgical History:  Procedure Laterality Date   CATARACT EXTRACTION, BILATERAL  05/2016   COLONOSCOPY  last 10/03/2015   CORONARY STENT PLACEMENT  2009   KNEE ARTHROSCOPY Left 1997   KNEE ARTHROSCOPY Right 2005   REVERSE SHOULDER ARTHROPLASTY Right 01/11/2022   Procedure: REVERSE SHOULDER ARTHROPLASTY;  Surgeon: [name] Britain, MD;  Location: [facility];  Service: Orthopedics;  Laterality: Right;  153mn   VASECTOMY  1980   WISDOM TOOTH EXTRACTION  1980    Current Medications: Current Meds  Medication Sig    amoxicillin (AMOXIL) 500 MG tablet Take 2,000 mg by mouth as directed. 1-2 Hours prior to dental work   aspirin 81 MG tablet Take 81 mg by mouth daily.     Coenzyme Q10 (CO Q 10) 100 MG CAPS Take 100 mg by mouth daily.   rosuvastatin (CRESTOR) 40 MG tablet Take 1 tablet (40 mg total) by mouth daily.     Allergies:   Patient has no known allergies.   Social History   Socioeconomic History   Marital status: Married    Spouse name: Not on file   Number of children: Not on file   Years of education: 12   Highest education level: Not on file  Occupational History   Occup[place] [employer] [place] [employer] [place]  Tobacco Use   Smoking status: Former    Packs/day: 1.00    Years: 40.00    Total pack years: 40.00    Types: Cigarettes    Quit date: 12/03/2007    Years since quitting: 14.5   Smokeless tobacco: Never  Vaping Use   Vaping Use: Never used  Substance and Sexual Activity   Alcohol use: No   Drug use: No   Sexual activity: Yes  Other Topics Concern   Not on file  Social History Narrative   [name]  with his wife and her 3 children. He works at [employer] & frequently lifts heavy objects.   Social Determinants of Health   Financial Resource Strain: Not on file  Food Insecurity: Not on file  Transportation Needs: Not on file  Physical Activity: Not on file  Stress: Not on file  Social Connections: Not on file     Family History: The patient's family history includes Heart disease in his father, mother, and paternal uncle; Stroke in his mother and sister. There is no history of Colon cancer, Rectal cancer, Stomach cancer, or Esophageal cancer.  ROS:   Please see the history of present illness.    All other systems reviewed and are negative.  EKGs/Labs/Other Studies Reviewed:    The following studies were reviewed today: Echo 06-15-2020:  1. Left ventricular ejection fraction, by estimation, is 55 to 60%. The  left ventricle  has normal function. The left ventricle has no regional  wall motion abnormalities. Left ventricular diastolic parameters were  normal.   2. Right ventricular systolic function is normal. The right ventricular  size is normal.   3. The mitral valve is normal in structure. Mild mitral valve  regurgitation. No evidence of mitral stenosis.   4. The aortic valve is tricuspid. There is mild calcification of the  aortic valve. There is mild thickening of the aortic valve. Aortic valve  regurgitation is not visualized.   EKG:  EKG is ordered today.  The ekg ordered today demonstrates NSR 69 bpm, within normal limits  Recent Labs: 01/04/2022: Hemoglobin 16.0; Platelets 213 03/16/2022: ALT 21 05/23/2022: BUN 12; Creat 0.98; Potassium 4.5; Sodium 142  Recent Lipid Panel    Component Value Date/Time   CHOL 136 03/16/2022 0748   TRIG 59 03/16/2022 0748   HDL 60 03/16/2022 0748   CHOLHDL 2.3 03/16/2022 0748   CHOLHDL 3.1 02/13/2016 0733   VLDL 19 02/13/2016 0733   LDLCALC 63 03/16/2022 0748     Risk Assessment/Calculations:                Physical Exam:    VS:  BP 130/80 (BP Location: Left Arm, Patient Position: Sitting, Cuff Size: Normal)   Pulse 69   Ht 5' 11.26" (1.81 m)   Wt 170 lb (77.1 kg)   SpO2 93%   BMI 23.54 kg/m     Wt Readings from Last 3 Encounters:  06/05/22 170 lb (77.1 kg)  05/23/22 170 lb 3.2 oz (77.2 kg)  01/11/22 168 lb (76.2 kg)     GEN:  Well nourished, well developed in no acute distress HEENT: Normal NECK: No JVD; No carotid bruits LYMPHATICS: No lymphadenopathy CARDIAC: RRR, no murmurs, rubs, gallops RESPIRATORY:  Clear to auscultation without rales, wheezing or rhonchi  ABDOMEN: Soft, non-tender, non-distended MUSCULOSKELETAL:  No edema; No deformity.  Venous varicosities in the lower extremities left greater than right. SKIN: Warm and dry NEUROLOGIC:  Alert and oriented x 3 PSYCHIATRIC:  Normal affect   ASSESSMENT:    1. Mixed hyperlipidemia    2. Coronary artery disease involving native coronary artery of native heart without angina pectoris    PLAN:    In order of problems listed above:  Most recent lipids reviewed with cholesterol 136, HDL 60, LDL 63.  The patient will continue on rosuvastatin 40 mg daily.  He takes good care of himself and follows a healthy lifestyle.  I will see him back in 1 year. Very active lifestyle, riding his bike regularly for exercise with  no exertional symptoms.  Continue aspirin for antiplatelet therapy and high intensity statin drug.  Follow-up in 1 year.           Medication Adjustments/Labs and Tests Ordered: Current medicines are reviewed at length with the patient today.  Concerns regarding medicines are outlined above.  Orders Placed This Encounter  Procedures   EKG 12-Lead   No orders of the defined types were placed in this encounter.   Patient Instructions  Medication Instructions:  Your physician recommends that you continue on your current medications as directed. Please refer to the Current Medication list given to you today.  *If you need a refill on your cardiac medications before your next appointment, please call your pharmacy*   Lab Work: NONE If you have labs (blood work) drawn today and your tests are completely normal, you will receive your results only by: [place] (if you have MyChart) OR A paper copy in the mail If you have any lab test that is abnormal or we need to change your treatment, we will call you to review the results.   Testing/Procedures: NONE   Follow-Up: At [facility], you and your health needs are our priority.  As part of our continuing mission to provide you with exceptional heart care, we have created designated Provider Care Teams.  These Care Teams include your primary Cardiologist (physician) and Advanced Practice Providers (APPs -  Physician Assistants and Nurse Practitioners) who all work together to provide you with  the care you need, when you need it.  Your next appointment:   1 year(s)  The format for your next appointment:   In Person  Provider:   Sherren Mocha, MD       Important Information About Sugar         Signed, [name] Mocha, MD  06/06/2022 11:09 AM    [place]

## 2022-06-20 ENCOUNTER — Ambulatory Visit: Payer: Medicare Other | Admitting: Family Medicine

## 2022-07-02 DIAGNOSIS — Z96611 Presence of right artificial shoulder joint: Secondary | ICD-10-CM | POA: Diagnosis not present

## 2022-09-26 ENCOUNTER — Telehealth: Payer: Self-pay | Admitting: Family Medicine

## 2022-09-26 NOTE — Telephone Encounter (Signed)
Copied from [place] [id number]. Topic: Medicare AWV >> Sep 26, 2022  9:58 AM Gillis Santa wrote: Reason for CRM:   LVM FOR PATIENT TO CALL [phone] TO [place] TELE VISIT

## 2022-10-22 ENCOUNTER — Telehealth: Payer: Self-pay | Admitting: Family Medicine

## 2022-10-22 NOTE — Telephone Encounter (Signed)
Called patient to schedule Medicare Annual Wellness Visit (AWV). Left message for patient to call back and schedule Medicare Annual Wellness Visit (AWV).    Please schedule an appointment at any time with Otila Kluver, [facility].  If any questions, please contact me at [phone].  Thank you ,  [place] Direct Dial: [phone]

## 2022-10-22 NOTE — Telephone Encounter (Signed)
Copied from [place] [id number]. Topic: Medicare AWV >> Oct 22, 2022 12:12 PM Gillis Santa wrote: Reason for CRM: LVM PATIENT TO CALL KAREN [phone] SCHEDULE AWVI WITH HEALTH COACH TINA TELE VISIT ONLY WED

## 2022-11-05 ENCOUNTER — Telehealth: Payer: Self-pay | Admitting: Family Medicine

## 2022-11-05 NOTE — Telephone Encounter (Signed)
Contacted [name] [name] to schedule their annual wellness visit. Appointment made for 11/07/2022.  [place] Direct Dial [phone]

## 2022-11-07 ENCOUNTER — Ambulatory Visit (INDEPENDENT_AMBULATORY_CARE_PROVIDER_SITE_OTHER): Payer: Medicare Other

## 2022-11-07 VITALS — Wt 173.0 lb

## 2022-11-07 DIAGNOSIS — Z0000 Encounter for general adult medical examination without abnormal findings: Secondary | ICD-10-CM | POA: Diagnosis not present

## 2022-11-07 NOTE — Progress Notes (Signed)
I connected with  [name] [name] on 11/07/22 by a audio enabled telemedicine application and verified that I am speaking with the correct person using two identifiers.  Patient Location: Home  Provider Location: Home Office  I discussed the limitations of evaluation and management by telemedicine. The patient expressed understanding and agreed to proceed.   Subjective:   [name] [name] is a 72 y.o. male who presents for an Initial Medicare Annual Wellness Visit.  Review of Systems     Cardiac Risk Factors include: advanced age (>73mn, >>44women);hypertension;dyslipidemia;male gender     Objective:    Today's Vitals   11/07/22 0932  Weight: 173 lb (78.5 kg)   Body mass index is 23.95 kg/m.     11/07/2022    9:36 AM 01/04/2022    2:08 PM 07/16/2016   12:47 PM  Advanced Directives  Does Patient Have a Medical Advance Directive? Yes No No  Type of AParamedicof [place] will    Copy of [place]in Chart? No - copy requested    Would patient like information on creating a medical advance directive?  No - Patient declined     Current Medications (verified) Outpatient Encounter Medications as of 11/07/2022  Medication Sig   ABRYSVO 120 MCG/0.5ML injection    aspirin 81 MG tablet Take 81 mg by mouth daily.     Coenzyme Q10 (CO Q 10) 100 MG CAPS Take 100 mg by mouth daily.   SPIKEVAX injection    amoxicillin (AMOXIL) 500 MG tablet Take 2,000 mg by mouth as directed. 1-2 Hours prior to dental work (Patient not taking: Reported on 11/07/2022)   rosuvastatin (CRESTOR) 40 MG tablet Take 1 tablet (40 mg total) by mouth daily.   No facility-administered encounter medications on file as of 11/07/2022.    Allergies (verified) Patient has no known allergies.   History: Past Medical History:  Diagnosis Date   Chicken pox    Colon polyps    Coronary artery disease    COVID-19 05/2021   [place]    History of  shingles    History of tobacco use    Hyperlipidemia    Hypertension    Myocardial infarction ([place] 12/[place]   stent   Past Surgical History:  Procedure Laterality Date   CATARACT EXTRACTION, BILATERAL  05/2016   COLONOSCOPY  last 10/03/2015   CORONARY STENT PLACEMENT  2009   KNEE ARTHROSCOPY Left 1997   KNEE ARTHROSCOPY Right 2005   REVERSE SHOULDER ARTHROPLASTY Right 01/11/2022   Procedure: REVERSE SHOULDER ARTHROPLASTY;  Surgeon: SJustice Britain MD;  Location: [facility];  Service: Orthopedics;  Laterality: Right;  1217m   VASECTOMY  1980   WISDOM TOOTH EXTRACTION  1980   Family History  Problem Relation Age of Onset   Heart disease Father    Heart disease Paternal Uncle    Heart disease Mother    Stroke Mother    Stroke Sister    Colon cancer Neg Hx    Rectal cancer Neg Hx    Stomach cancer Neg Hx    Esophageal cancer Neg Hx    Social History   Socioeconomic History   Marital status: Married    Spouse name: Not on file   Number of children: Not on file   Years of education: 12   Highest education level: Not on file  Occupational History   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  Tobacco Use   Smoking status: Former    Packs/day: 1.00    Years: 40.00    Total pack years: 40.00    Types: Cigarettes    Quit date: 12/03/2007    Years since quitting: 14.9   Smokeless tobacco: Never  Vaping Use   Vaping Use: Never used  Substance and Sexual Activity   Alcohol use: No   Drug use: No   Sexual activity: Yes  Other Topics Concern   Not on file  Social History Narrative   [name] [name] lives with his wife and her 3 children. He works at [employer] & frequently lifts heavy objects.   Social Determinants of Health   Financial Resource Strain: Low Risk  (11/07/2022)   Overall Financial Resource Strain (CARDIA)    Difficulty of Paying Living Expenses: Not hard at all  Food Insecurity: No Food Insecurity (11/07/2022)   Hunger Vital Sign     Worried About Running Out of Food in the Last Year: Never true    Ran Out of Food in the Last Year: Never true  Transportation Needs: No Transportation Needs (11/07/2022)   PRAPARE - Hydrologist (Medical): No    Lack of Transportation (Non-Medical): No  Physical Activity: Sufficiently Active (11/07/2022)   Exercise Vital Sign    Days of Exercise per Week: 3 days    Minutes of Exercise per Session: 150+ min  Stress: No Stress Concern Present (11/07/2022)   [place]    Feeling of Stress : Not at all  Social Connections: Moderately Integrated (11/07/2022)   Social Connection and Isolation Panel [NHANES]    Frequency of Communication with Friends and Family: More than three times a week    Frequency of Social Gatherings with Friends and Family: More than three times a week    Attends Religious Services: More than 4 times per year    Active Member of [employer] or Organizations: No    Attends Music therapist: Never    Marital Status: Married    Tobacco Counseling Counseling given: Not Answered   Clinical Intake:  Pre-visit preparation completed: Yes  Pain : No/denies pain     BMI - recorded: 23.95 Nutritional Status: BMI of 19-24  Normal Nutritional Risks: None Diabetes: No  How often do you need to have someone help you when you read instructions, pamphlets, or other written materials from your doctor or pharmacy?: 1 - Never  Diabetic?no  Interpreter Needed?: No  Information entered by :: Charlott Rakes, LPN   Activities of Daily Living    11/07/2022    9:37 AM 01/04/2022    2:09 PM  In your present state of health, do you have any difficulty performing the following activities:  Hearing? 0   Vision? 0   Difficulty concentrating or making decisions? 0   Walking or climbing stairs? 0   Dressing or bathing? 0   Doing errands, shopping? 0 0  Preparing Food and eating  ? N   Using the Toilet? N   In the past six months, have you accidently leaked urine? N   Do you have problems with loss of bowel control? N   Managing your Medications? N   Managing your Finances? N   Housekeeping or managing your Housekeeping? N     Patient Care Team: Ma Hillock, DO as PCP - General (Family Medicine) Sherren Mocha, MD as PCP - Cardiology (Cardiology) Wilfrid Lund  L III, MD as Consulting Physician (Gastroenterology)  Indicate any recent Medical Services you may have received from other than [facility] providers in the past year (date may be approximate).     Assessment:   This is a routine wellness examination for [name] [name].  Hearing/Vision screen Hearing Screening - Comments:: Pt denies any hearing issues  Vision Screening - Comments:: Pt follows up with [facility] for annual eye exams   Dietary issues and exercise activities discussed: Current Exercise Habits: Home exercise routine, Type of exercise: Other - see comments (biking), Time (Minutes): > 60, Frequency (Times/Week): 3, Weekly Exercise (Minutes/Week): 0   Goals Addressed             This Visit's Progress    Patient Stated       Maintain health        Depression Screen    11/07/2022    9:35 AM 05/23/2022    1:49 PM 02/09/2019    9:03 AM 05/09/2018    1:29 PM 05/27/2017   10:19 AM  PHQ 2/9 Scores  PHQ - 2 Score 0 0 0 0 0    Fall Risk    11/07/2022    9:37 AM 05/23/2022    1:50 PM 02/09/2019    9:03 AM 05/09/2018    1:29 PM  Fall Risk   Falls in the past year? 0 0 0 No  Number falls in past yr: 0 0    Injury with Fall? 0 0    Risk for fall due to : No Fall Risks No Fall Risks    Follow up Falls prevention discussed Falls evaluation completed Falls evaluation completed;Education provided;Falls prevention discussed     FALL RISK PREVENTION PERTAINING TO THE HOME:  Any stairs in or around the home? Yes  If so, are there any without handrails? No  Home free of loose throw rugs in  walkways, pet beds, electrical cords, etc? Yes  Adequate lighting in your home to reduce risk of falls? Yes   ASSISTIVE DEVICES UTILIZED TO PREVENT FALLS:  Life alert? No  Use of a cane, walker or w/c? No  Grab bars in the bathroom? No  Shower chair or bench in shower? No  Elevated toilet seat or a handicapped toilet? No   TIMED UP AND GO:  Was the test performed? No .  Cognitive Function:        11/07/2022    9:38 AM  6CIT Screen  What Year? 0 points  What month? 0 points  What time? 0 points  Count back from 20 0 points  Months in reverse 0 points  Repeat phrase 0 points  Total Score 0 points    Immunizations Immunization History  Administered Date(s) Administered   Fluad Quad(high Dose 65+) 06/01/2019, 07/01/2020, 05/23/2022   Influenza, High Dose Seasonal PF 05/09/2018   Influenza,inj,Quad PF,6+ Mos 07/02/2017   PFIZER(Purple Top)SARS-COV-2 Vaccination 10/08/2019, 11/02/2019   Pneumococcal Conjugate-13 07/02/2017   Pneumococcal Polysaccharide-23 05/09/2018   Tdap 02/09/2019   Zoster Recombinat (Shingrix) 05/28/2018, 08/29/2018    TDAP status: Up to date  Flu Vaccine status: Up to date  Pneumococcal vaccine status: Up to date  Covid-19 vaccine status: Completed vaccines  Qualifies for Shingles Vaccine? Yes   Zostavax completed Yes   Shingrix Completed?: Yes  Screening Tests Health Maintenance  Topic Date Due   Lung Cancer Screening  05/28/2018   COVID-19 Vaccine (3 - Pfizer risk series) 11/30/2019   Medicare Annual Wellness (AWV)  11/07/2023   COLONOSCOPY (  Pts 45-59yr Insurance coverage will need to be confirmed)  07/12/2026   DTaP/Tdap/Td (2 - Td or Tdap) 02/08/2029   Pneumonia Vaccine 72 Years old  Completed   INFLUENZA VACCINE  Completed   Zoster Vaccines- Shingrix  Completed   HPV VACCINES  Aged Out   Hepatitis C Screening  Discontinued    Health Maintenance  Health Maintenance Due  Topic Date Due   Lung Cancer Screening  05/28/2018    COVID-19 Vaccine (3 - Pfizer risk series) 11/30/2019    Colorectal cancer screening: Type of screening: Colonoscopy. Completed 07/13/19. Repeat every 7 years  Lung Cancer Screening: (Low Dose CT Chest recommended if Age 584-80years, 30 pack-year currently smoking OR have quit w/in 15years.) does qualify.   Lung Cancer Screening Referral: not at this time   Additional Screening:  Hepatitis C Screening: does not qualify;  Vision Screening: Recommended annual ophthalmology early detection of glaucoma and other disorders of the eye. Is the patient up to date with their annual eye exam?  Yes  Who is the provider or what is the name of the office in which the patient attends annual eye exams? [facility]  If pt is not established with a provider, would they like to be referred to a provider to establish care? No .   Dental Screening: Recommended annual dental exams for proper oral hygiene  Community Resource Referral / Chronic Care Management: CRR required this visit?  No   CCM required this visit?  No      Plan:     I have personally reviewed and noted the following in the patient's chart:   Medical and social history Use of alcohol, tobacco or illicit drugs  Current medications and supplements including opioid prescriptions. Patient is not currently taking opioid prescriptions. Functional ability and status Nutritional status Physical activity Advanced directives List of other physicians Hospitalizations, surgeries, and ER visits in previous 12 months Vitals Screenings to include cognitive, depression, and falls Referrals and appointments  In addition, I have reviewed and discussed with patient certain preventive protocols, quality metrics, and best practice recommendations. A written personalized care plan for preventive services as well as general preventive health recommendations were provided to patient.     TWillette Brace LPN   [id number]  Nurse Notes: none

## 2022-11-07 NOTE — Patient Instructions (Signed)
Mr. [name] [name] , Thank you for taking time to come for your Medicare Wellness Visit. I appreciate your ongoing commitment to your health goals. Please review the following plan we discussed and let me know if I can assist you in the future.   These are the goals we discussed:  Goals      Patient Stated     Maintain health         This is a list of the screening recommended for you and due dates:  Health Maintenance  Topic Date Due   Screening for Lung Cancer  05/28/2018   COVID-19 Vaccine (3 - Pfizer risk series) 11/30/2019   Medicare Annual Wellness Visit  11/07/2023   Colon Cancer Screening  07/12/2026   DTaP/Tdap/Td vaccine (2 - Td or Tdap) 02/08/2029   Pneumonia Vaccine  Completed   Flu Shot  Completed   Zoster (Shingles) Vaccine  Completed   HPV Vaccine  Aged Out   Hepatitis C Screening: USPSTF Recommendation to screen - Ages 73-79 yo.  Discontinued    Advanced directives: Please bring a copy of your health care power of attorney and living will to the office at your convenience.  Conditions/risks identified: maintain health   Next appointment: Follow up in one year for your annual wellness visit.   Preventive Care 68 Years and Older, Male  Preventive care refers to lifestyle choices and visits with your health care provider that can promote health and wellness. What does preventive care include? A yearly physical exam. This is also called an annual well check. Dental exams once or twice a year. Routine eye exams. Ask your health care provider how often you should have your eyes checked. Personal lifestyle choices, including: Daily care of your teeth and gums. Regular physical activity. Eating a healthy diet. Avoiding tobacco and drug use. Limiting alcohol use. Practicing safe sex. Taking low doses of aspirin every day. Taking vitamin and mineral supplements as recommended by your health care provider. What happens during an annual well check? The services and  screenings done by your health care provider during your annual well check will depend on your age, overall health, lifestyle risk factors, and family history of disease. Counseling  Your health care provider may ask you questions about your: Alcohol use. Tobacco use. Drug use. Emotional well-being. Home and relationship well-being. Sexual activity. Eating habits. History of falls. Memory and ability to understand (cognition). Work and work Statistician. Screening  You may have the following tests or measurements: Height, weight, and BMI. Blood pressure. Lipid and cholesterol levels. These may be checked every 5 years, or more frequently if you are over 76 years old. Skin check. Lung cancer screening. You may have this screening every year starting at age 91 if you have a 30-pack-year history of smoking and currently smoke or have quit within the past 15 years. Fecal occult blood test (FOBT) of the stool. You may have this test every year starting at age 33. Flexible sigmoidoscopy or colonoscopy. You may have a sigmoidoscopy every 5 years or a colonoscopy every 10 years starting at age 55. Prostate cancer screening. Recommendations will vary depending on your family history and other risks. Hepatitis C blood test. Hepatitis B blood test. Sexually transmitted disease (STD) testing. Diabetes screening. This is done by checking your blood sugar (glucose) after you have not eaten for a while (fasting). You may have this done every 1-3 years. Abdominal aortic aneurysm (AAA) screening. You may need this if you are a current  or former smoker. Osteoporosis. You may be screened starting at age 21 if you are at high risk. Talk with your health care provider about your test results, treatment options, and if necessary, the need for more tests. Vaccines  Your health care provider may recommend certain vaccines, such as: Influenza vaccine. This is recommended every year. Tetanus, diphtheria, and  acellular pertussis (Tdap, Td) vaccine. You may need a Td booster every 10 years. Zoster vaccine. You may need this after age 19. Pneumococcal 13-valent conjugate (PCV13) vaccine. One dose is recommended after age 28. Pneumococcal polysaccharide (PPSV23) vaccine. One dose is recommended after age 62. Talk to your health care provider about which screenings and vaccines you need and how often you need them. This information is not intended to replace advice given to you by your health care provider. Make sure you discuss any questions you have with your health care provider. Document Released: 09/16/2015 Document Revised: 05/09/2016 Document Reviewed: 06/21/2015 Elsevier Interactive Patient Education  2017 [place] Prevention in the Home Falls can cause injuries. They can happen to people of all ages. There are many things you can do to make your home safe and to help prevent falls. What can I do on the outside of my home? Regularly fix the edges of walkways and driveways and fix any cracks. Remove anything that might make you trip as you walk through a door, such as a raised step or threshold. Trim any bushes or trees on the path to your home. Use bright outdoor lighting. Clear any walking paths of anything that might make someone trip, such as rocks or tools. Regularly check to see if handrails are loose or broken. Make sure that both sides of any steps have handrails. Any raised decks and porches should have guardrails on the edges. Have any leaves, snow, or ice cleared regularly. Use sand or salt on walking paths during winter. Clean up any spills in your garage right away. This includes oil or grease spills. What can I do in the bathroom? Use night lights. Install grab bars by the toilet and in the tub and shower. Do not use towel bars as grab bars. Use non-skid mats or decals in the tub or shower. If you need to sit down in the shower, use a plastic, non-slip stool. Keep  the floor dry. Clean up any water that spills on the floor as soon as it happens. Remove soap buildup in the tub or shower regularly. Attach bath mats securely with double-sided non-slip rug tape. Do not have throw rugs and other things on the floor that can make you trip. What can I do in the bedroom? Use night lights. Make sure that you have a light by your bed that is easy to reach. Do not use any sheets or blankets that are too big for your bed. They should not hang down onto the floor. Have a firm chair that has side arms. You can use this for support while you get dressed. Do not have throw rugs and other things on the floor that can make you trip. What can I do in the kitchen? Clean up any spills right away. Avoid walking on wet floors. Keep items that you use a lot in easy-to-reach places. If you need to reach something above you, use a strong step stool that has a grab bar. Keep electrical cords out of the way. Do not use floor polish or wax that makes floors slippery. If you must use wax,  use non-skid floor wax. Do not have throw rugs and other things on the floor that can make you trip. What can I do with my stairs? Do not leave any items on the stairs. Make sure that there are handrails on both sides of the stairs and use them. Fix handrails that are broken or loose. Make sure that handrails are as long as the stairways. Check any carpeting to make sure that it is firmly attached to the stairs. Fix any carpet that is loose or worn. Avoid having throw rugs at the top or bottom of the stairs. If you do have throw rugs, attach them to the floor with carpet tape. Make sure that you have a light switch at the top of the stairs and the bottom of the stairs. If you do not have them, ask someone to add them for you. What else can I do to help prevent falls? Wear shoes that: Do not have high heels. Have rubber bottoms. Are comfortable and fit you well. Are closed at the toe. Do not  wear sandals. If you use a stepladder: Make sure that it is fully opened. Do not climb a closed stepladder. Make sure that both sides of the stepladder are locked into place. Ask someone to hold it for you, if possible. Clearly mark and make sure that you can see: Any grab bars or handrails. First and last steps. Where the edge of each step is. Use tools that help you move around (mobility aids) if they are needed. These include: Canes. Walkers. Scooters. Crutches. Turn on the lights when you go into a dark area. Replace any light bulbs as soon as they burn out. Set up your furniture so you have a clear path. Avoid moving your furniture around. If any of your floors are uneven, fix them. If there are any pets around you, be aware of where they are. Review your medicines with your doctor. Some medicines can make you feel dizzy. This can increase your chance of falling. Ask your doctor what other things that you can do to help prevent falls. This information is not intended to replace advice given to you by your health care provider. Make sure you discuss any questions you have with your health care provider. Document Released: 06/16/2009 Document Revised: 01/26/2016 Document Reviewed: 09/24/2014 Elsevier Interactive Patient Education  2017 [employer].

## 2022-11-16 DIAGNOSIS — H40023 Open angle with borderline findings, high risk, bilateral: Secondary | ICD-10-CM | POA: Diagnosis not present

## 2022-12-12 ENCOUNTER — Ambulatory Visit (INDEPENDENT_AMBULATORY_CARE_PROVIDER_SITE_OTHER): Payer: Medicare Other | Admitting: Family

## 2022-12-12 ENCOUNTER — Encounter: Payer: Self-pay | Admitting: Family

## 2022-12-12 VITALS — BP 129/74 | HR 57 | Temp 98.2°F | Ht 71.26 in | Wt 171.0 lb

## 2022-12-12 DIAGNOSIS — J069 Acute upper respiratory infection, unspecified: Secondary | ICD-10-CM | POA: Diagnosis not present

## 2022-12-12 LAB — POC COVID19 BINAXNOW: SARS Coronavirus 2 Ag: NEGATIVE

## 2022-12-12 NOTE — Progress Notes (Signed)
Patient ID: [name] [name], male    DOB: 1951-03-18, 72 y.o.   MRN: [id number]  Chief Complaint  Patient presents with  . Sinus Problem    Pt c/o cough, chills, sneezing, Chest congestion and fatigue. Present since Friday. Has tried tylenol and nyquil which did not help. Covid negative Saturday and Monday.    HPI:      URI sx:  Pt c/o cough, chills, sneezing, Chest congestion and fatigue. Present since Friday. Has tried tylenol and nyquil which did not help. Covid negative Saturday and Monday. Reports dry cough, denies SOB. States he has had PNA in past, and he feels tightness, pressure & sharp pains similar to then.  Assessment & Plan:  1. URI, acute - lungs clear; checking CBC, pt requesting CXR, too late for outpatient today, advised pt to wait before going in am as I will check lab results & if positive I will treat w/o CXR result. Advised he seek ER if sx worsen or develops fever >100. Advised on 2L water qd, Tylenol/Aleve for pleuritic CP, fever.  - POC COVID-19 - CBC with Differential/Platelet   Subjective:    Outpatient Medications Prior to Visit  Medication Sig Dispense Refill  . ABRYSVO 120 MCG/0.5ML injection     . amoxicillin (AMOXIL) 500 MG tablet Take 2,000 mg by mouth as directed. 1-2 Hours prior to dental work    . aspirin 81 MG tablet Take 81 mg by mouth daily.      . Coenzyme Q10 (CO Q 10) 100 MG CAPS Take 100 mg by mouth daily.    Marland Kitchen SPIKEVAX injection     . rosuvastatin (CRESTOR) 40 MG tablet Take 1 tablet (40 mg total) by mouth daily. 90 tablet 3   No facility-administered medications prior to visit.   Past Medical History:  Diagnosis Date  . Chicken pox   . Colon polyps   . Coronary artery disease   . COVID-19 05/2021  . Emphysema of lung   . History of shingles   . History of tobacco use   . Hyperlipidemia   . Hypertension   . Myocardial infarction 12/08/2007   stent   Past Surgical History:  Procedure Laterality Date  . CATARACT EXTRACTION,  BILATERAL  05/2016  . COLONOSCOPY  last 10/03/2015  . CORONARY STENT PLACEMENT  2009  . KNEE ARTHROSCOPY Left 1997  . KNEE ARTHROSCOPY Right 2005  . REVERSE SHOULDER ARTHROPLASTY Right 01/11/2022   Procedure: REVERSE SHOULDER ARTHROPLASTY;  Surgeon: Francena Hanly, MD;  Location: [facility];  Service: Orthopedics;  Laterality: Right;   . VASECTOMY  1980  . WISDOM TOOTH EXTRACTION  1980   No Known Allergies    Objective:    Physical Exam Vitals and nursing note reviewed.  Constitutional:      General: He is not in acute distress.    Appearance: He is ill-appearing (looks tired, walking slowly).  HENT:     Head: Normocephalic.  Cardiovascular:     Rate and Rhythm: Normal rate and regular rhythm.  Pulmonary:     Effort: Pulmonary effort is normal.     Breath sounds: Normal breath sounds.  Musculoskeletal:        General: Normal range of motion.     Cervical back: Normal range of motion.  Skin:    General: Skin is warm and dry.  Neurological:     Mental Status: He is alert and oriented to person, place, and time.  Psychiatric:  Mood and Affect: Mood normal.   BP 129/74 (BP Location: Left Arm, Patient Position: Sitting, Cuff Size: Large)   Pulse (!) 57   Temp 98.2 F (36.8 C) (Temporal)   Ht 5' 11.26" (1.81 m)   Wt 171 lb (77.6 kg)   SpO2 95%   BMI 23.68 kg/m  Wt Readings from Last 3 Encounters:  12/12/22 171 lb (77.6 kg)  11/07/22 173 lb (78.5 kg)  06/05/22 170 lb (77.1 kg)      Dulce Sellar, NP

## 2022-12-13 ENCOUNTER — Telehealth: Payer: Self-pay | Admitting: Family Medicine

## 2022-12-13 LAB — CBC WITH DIFFERENTIAL/PLATELET
Basophils Absolute: 0.1 10*3/uL (ref 0.0–0.1)
Basophils Relative: 1.1 % (ref 0.0–3.0)
Eosinophils Absolute: 0 10*3/uL (ref 0.0–0.7)
Eosinophils Relative: 0.1 % (ref 0.0–5.0)
HCT: 46.8 % (ref 39.0–52.0)
Hemoglobin: 16 g/dL (ref 13.0–17.0)
Lymphocytes Relative: 17.3 % (ref 12.0–46.0)
Lymphs Abs: 0.9 10*3/uL (ref 0.7–4.0)
MCHC: 34.1 g/dL (ref 30.0–36.0)
MCV: 96.1 fl (ref 78.0–100.0)
Monocytes Absolute: 0.8 10*3/uL (ref 0.1–1.0)
Monocytes Relative: 14.9 % — ABNORMAL HIGH (ref 3.0–12.0)
Neutro Abs: 3.6 10*3/uL (ref 1.4–7.7)
Neutrophils Relative %: 66.6 % (ref 43.0–77.0)
Platelets: 134 10*3/uL — ABNORMAL LOW (ref 150.0–400.0)
RBC: 4.88 Mil/uL (ref 4.22–5.81)
RDW: 14.1 % (ref 11.5–15.5)
WBC: 5.4 10*3/uL (ref 4.0–10.5)

## 2022-12-13 NOTE — Progress Notes (Signed)
Call [name] [name] and let him know his blood count is normal, no indication of a bacterial infection. Let me know if he still wants to have a chest xray done, and I can enter the order and I gave him the address yesterday.  I would recommend it if he has had continued fever, more mucus that he is coughing up, green in color, etc.  I can also send an inhaler, prednisone, and/or cough syrup to help with his symptoms. Let me know, thx.

## 2022-12-13 NOTE — Telephone Encounter (Signed)
Spoke with patient. Sent message to PCP with patient concerns.

## 2022-12-13 NOTE — Telephone Encounter (Signed)
Caller states patient saw results and has a couple of questions. Patient can be reached at [phone].

## 2023-05-15 ENCOUNTER — Other Ambulatory Visit: Payer: Self-pay | Admitting: Family Medicine

## 2023-06-10 ENCOUNTER — Encounter: Payer: Medicare Other | Admitting: Family Medicine

## 2023-06-10 ENCOUNTER — Ambulatory Visit: Payer: Medicare Other | Attending: Cardiovascular Disease | Admitting: Cardiovascular Disease

## 2023-06-10 ENCOUNTER — Encounter: Payer: Self-pay | Admitting: Cardiovascular Disease

## 2023-06-10 VITALS — BP 136/78 | HR 56 | Ht 71.5 in | Wt 173.0 lb

## 2023-06-10 DIAGNOSIS — I700 Atherosclerosis of aorta: Secondary | ICD-10-CM

## 2023-06-10 DIAGNOSIS — E782 Mixed hyperlipidemia: Secondary | ICD-10-CM | POA: Diagnosis not present

## 2023-06-10 DIAGNOSIS — I252 Old myocardial infarction: Secondary | ICD-10-CM | POA: Diagnosis not present

## 2023-06-10 NOTE — Patient Instructions (Signed)
Medication Instructions:  Your physician recommends that you continue on your current medications as directed. Please refer to the Current Medication list given to you today.  *If you need a refill on your cardiac medications before your next appointment, please call your pharmacy*   Lab Work: CMET, Lipids today If you have labs (blood work) drawn today and your tests are completely normal, you will receive your results only by: MyChart Message (if you have MyChart) OR A paper copy in the mail If you have any lab test that is abnormal or we need to change your treatment, we will call you to review the results.   Testing/Procedures: NONE   Follow-Up: At [facility], you and your health needs are our priority.  As part of our continuing mission to provide you with exceptional heart care, we have created designated Provider Care Teams.  These Care Teams include your primary Cardiologist (physician) and Advanced Practice Providers (APPs -  Physician Assistants and Nurse Practitioners) who all work together to provide you with the care you need, when you need it.  We recommend signing up for the patient portal called "MyChart".  Sign up information is provided on this After Visit Summary.  MyChart is used to connect with patients for Virtual Visits (Telemedicine).  Patients are able to view lab/test results, encounter notes, upcoming appointments, etc.  Non-urgent messages can be sent to your provider as well.   To learn more about what you can do with MyChart, go to [email or web address].    Your next appointment:   1 year(s)  Provider:   Michael Cooper, MD      

## 2023-06-10 NOTE — Progress Notes (Signed)
Cardiology Office Note:    Date:  06/10/2023   ID:  [name] [name], DOB 1950/10/11, MRN [id number]  PCP:  Natalia Leatherwood, DO   [facility] Providers Cardiologist:  Tonny Bollman, MD     Referring MD: Natalia Leatherwood, DO   Chief Complaint  Patient presents with   Coronary Artery Disease    History of Present Illness:    [name] [name] is a 72 y.o. male with a hx of  coronary artery disease, presenting for follow-up evaluation.  The patient presented in 2009 with an acute MI treated with bare-metal stenting of the LAD.  Other cardiovascular problems include mixed hyperlipidemia.  He has done very well since his initial event with normal LV systolic function.   The patient is here alone today.  He is doing very well.  He continues to work at [employer] a few days per week.  He rides his bike about 25 miles each week and does a lot of work out in his yard.  He has no exertional symptoms. Today, he denies symptoms of palpitations, chest pain, shortness of breath, orthopnea, PND, lower extremity edema, dizziness, or syncope.  He feels well and has no concerns about his medications today.   Past Medical History:  Diagnosis Date   Chicken pox    Colon polyps    Coronary artery disease    COVID-19 05/2021   Emphysema of lung (HCC)    History of shingles    History of tobacco use    Hyperlipidemia    Hypertension    Myocardial infarction (HCC) 12/08/2007   stent    Past Surgical History:  Procedure Laterality Date   CATARACT EXTRACTION, BILATERAL  05/2016   COLONOSCOPY  last 10/03/2015   CORONARY STENT PLACEMENT  2009   KNEE ARTHROSCOPY Left 1997   KNEE ARTHROSCOPY Right 2005   REVERSE SHOULDER ARTHROPLASTY Right 01/11/2022   Procedure: REVERSE SHOULDER ARTHROPLASTY;  Surgeon: Francena Hanly, MD;  Location: [facility];  Service: Orthopedics;  Laterality: Right;    VASECTOMY  1980   WISDOM TOOTH EXTRACTION  1980    Current Medications: Current Meds   Medication Sig   ABRYSVO 120 MCG/0.5ML injection    aspirin 81 MG tablet Take 81 mg by mouth daily.     Coenzyme Q10 (CO Q 10) 100 MG CAPS Take 100 mg by mouth daily.   rosuvastatin (CRESTOR) 40 MG tablet TAKE 1 TABLET BY MOUTH DAILY   [DISCONTINUED] amoxicillin (AMOXIL) 500 MG tablet Take 2,000 mg by mouth as directed. 1-2 Hours prior to dental work   [DISCONTINUED] [facility] injection      Allergies:   Patient has no known allergies.   Social History   Socioeconomic History   Marital status: Married    Spouse name: Not on file   Number of children: Not on file   Years of education: 12   Highest education level: Not on file  Occupational History   Occupation: Farm and Conservation officer, historic buildings: [employer]  Tobacco Use   Smoking status: Former    Current packs/day: 0.00    Average packs/day: 1 pack/day for 40.0 years (40.0 ttl pk-yrs)    Types: Cigarettes    Start date: 12/03/1967    Quit date: 12/03/2007    Years since quitting: 15.5   Smokeless tobacco: Never  Vaping Use   Vaping status: Never Used  Substance and Sexual Activity   Alcohol use: No  Drug use: No   Sexual activity: Yes  Other Topics Concern   Not on file  Social History Narrative   [name] [name] lives with his wife and her 3 children. He works at [employer] & frequently lifts heavy objects.   Social Determinants of Health   Financial Resource Strain: Low Risk  (11/07/2022)   Overall Financial Resource Strain (CARDIA)    Difficulty of Paying Living Expenses: Not hard at all  Food Insecurity: No Food Insecurity (11/07/2022)   Hunger Vital Sign    Worried About Running Out of Food in the Last Year: Never true    Ran Out of Food in the Last Year: Never true  Transportation Needs: No Transportation Needs (11/07/2022)   PRAPARE - Administrator, Civil Service (Medical): No    Lack of Transportation (Non-Medical): No  Physical Activity: Sufficiently Active (11/07/2022)   Exercise Vital  Sign    Days of Exercise per Week: 3 days    Minutes of Exercise per Session: 150+ min  Stress: No Stress Concern Present (11/07/2022)   [employer] of Occupational Health - Occupational Stress Questionnaire    Feeling of Stress : Not at all  Social Connections: Moderately Integrated (11/07/2022)   Social Connection and Isolation Panel [NHANES]    Frequency of Communication with Friends and Family: More than three times a week    Frequency of Social Gatherings with Friends and Family: More than three times a week    Attends Religious Services: More than 4 times per year    Active Member of [employer] or Organizations: No    Attends Banker Meetings: Never    Marital Status: Married     Family History: The patient's family history includes Heart disease in his father, mother, and paternal uncle; Stroke in his mother and sister. There is no history of Colon cancer, Rectal cancer, Stomach cancer, or Esophageal cancer.  ROS:   Please see the history of present illness.    All other systems reviewed and are negative.  EKGs/Labs/Other Studies Reviewed:    The following studies were reviewed today: EKG Interpretation Date/Time:  Monday June 10 2023 08:21:17 EDT Ventricular Rate:  56 PR Interval:  176 QRS Duration:  86 QT Interval:  426 QTC Calculation: 411 R Axis:   72  Text Interpretation: Sinus bradycardia Otherwise within normal limits Confirmed by Tonny Bollman [phone]) on 06/10/2023 8:33:16 AM    Recent Labs: 12/12/2022: Hemoglobin 16.0; Platelets 134.0  Recent Lipid Panel    Component Value Date/Time   CHOL 136 03/16/2022 0748   TRIG 59 03/16/2022 0748   HDL 60 03/16/2022 0748   CHOLHDL 2.3 03/16/2022 0748   CHOLHDL 3.1 02/13/2016 0733   VLDL 19 02/13/2016 0733   LDLCALC 63 03/16/2022 0748     Risk Assessment/Calculations:                Physical Exam:    VS:  BP 136/78   Pulse (!) 56   Ht 5' 11.5" (1.816 m)   Wt 173 lb (78.5 kg)   SpO2 99%    BMI 23.79 kg/m     Wt Readings from Last 3 Encounters:  06/10/23 173 lb (78.5 kg)  12/12/22 171 lb (77.6 kg)  11/07/22 173 lb (78.5 kg)     GEN:  Well nourished, well developed in no acute distress HEENT: Normal NECK: No JVD; No carotid bruits LYMPHATICS: No lymphadenopathy CARDIAC: RRR, no murmurs, rubs, gallops RESPIRATORY:  Clear to auscultation  without rales, wheezing or rhonchi  ABDOMEN: Soft, non-tender, non-distended MUSCULOSKELETAL:  No edema; No deformity  SKIN: Warm and dry NEUROLOGIC:  Alert and oriented x 3 PSYCHIATRIC:  Normal affect   ASSESSMENT:    1. Aortic atherosclerosis (HCC)   2. History of MI (myocardial infarction)   3. Mixed hyperlipidemia    PLAN:    In order of problems listed above:  Continue on aspirin and statin drug. EKG shows no acute changes.  Patient is asymptomatic with no angina.  He continues on aspirin for antiplatelet therapy and high intensity statin drug. Check lipids and LFTs today.  Continue rosuvastatin.  Patient follows a healthy lifestyle.      Medication Adjustments/Labs and Tests Ordered: Current medicines are reviewed at length with the patient today.  Concerns regarding medicines are outlined above.  Orders Placed This Encounter  Procedures   Comprehensive metabolic panel   Lipid panel   EKG 12-Lead   No orders of the defined types were placed in this encounter.   Patient Instructions  Medication Instructions:  Your physician recommends that you continue on your current medications as directed. Please refer to the Current Medication list given to you today.  *If you need a refill on your cardiac medications before your next appointment, please call your pharmacy*   Lab Work: CMET, Lipids today If you have labs (blood work) drawn today and your tests are completely normal, you will receive your results only by: MyChart Message (if you have MyChart) OR A paper copy in the mail If you have any lab test that is  abnormal or we need to change your treatment, we will call you to review the results.   Testing/Procedures: NONE   Follow-Up: At [facility], you and your health needs are our priority.  As part of our continuing mission to provide you with exceptional heart care, we have created designated Provider Care Teams.  These Care Teams include your primary Cardiologist (physician) and Advanced Practice Providers (APPs -  Physician Assistants and Nurse Practitioners) who all work together to provide you with the care you need, when you need it.  We recommend signing up for the patient portal called "MyChart".  Sign up information is provided on this After Visit Summary.  MyChart is used to connect with patients for Virtual Visits (Telemedicine).  Patients are able to view lab/test results, encounter notes, upcoming appointments, etc.  Non-urgent messages can be sent to your provider as well.   To learn more about what you can do with MyChart, go to [email or web address].    Your next appointment:   1 year(s)  Provider:   Tonny Bollman, MD        Signed, Tonny Bollman, MD  06/10/2023 1:47 PM    [facility]

## 2023-06-11 LAB — COMPREHENSIVE METABOLIC PANEL
ALT: 26 [IU]/L (ref 0–44)
AST: 26 [IU]/L (ref 0–40)
Albumin: 4.4 g/dL (ref 3.8–4.8)
Alkaline Phosphatase: 78 [IU]/L (ref 44–121)
BUN/Creatinine Ratio: 16 (ref 10–24)
BUN: 15 mg/dL (ref 8–27)
Bilirubin Total: 0.4 mg/dL (ref 0.0–1.2)
CO2: 25 mmol/L (ref 20–29)
Calcium: 9.5 mg/dL (ref 8.6–10.2)
Chloride: 101 mmol/L (ref 96–106)
Creatinine, Ser: 0.94 mg/dL (ref 0.76–1.27)
Globulin, Total: 2.3 g/dL (ref 1.5–4.5)
Glucose: 86 mg/dL (ref 70–99)
Potassium: 5 mmol/L (ref 3.5–5.2)
Sodium: 140 mmol/L (ref 134–144)
Total Protein: 6.7 g/dL (ref 6.0–8.5)
eGFR: 86 mL/min/{1.73_m2} (ref >59)

## 2023-06-11 LAB — LIPID PANEL
Chol/HDL Ratio: 2.3 {ratio} (ref 0.0–5.0)
Cholesterol, Total: 144 mg/dL (ref 100–199)
HDL: 62 mg/dL (ref >39)
LDL Chol Calc (NIH): 67 mg/dL (ref 0–99)
Triglycerides: 74 mg/dL (ref 0–149)
VLDL Cholesterol Cal: 15 mg/dL (ref 5–40)

## 2023-06-17 ENCOUNTER — Encounter: Payer: Medicare Other | Admitting: Family Medicine

## 2023-06-19 ENCOUNTER — Ambulatory Visit: Payer: Medicare Other | Admitting: Family Medicine

## 2023-06-19 ENCOUNTER — Encounter: Payer: Self-pay | Admitting: Family Medicine

## 2023-06-19 VITALS — BP 140/90 | HR 57 | Temp 97.7°F | Ht 71.0 in | Wt 172.4 lb

## 2023-06-19 DIAGNOSIS — I700 Atherosclerosis of aorta: Secondary | ICD-10-CM | POA: Diagnosis not present

## 2023-06-19 DIAGNOSIS — Z0000 Encounter for general adult medical examination without abnormal findings: Secondary | ICD-10-CM

## 2023-06-19 DIAGNOSIS — Z23 Encounter for immunization: Secondary | ICD-10-CM

## 2023-06-19 DIAGNOSIS — I2510 Atherosclerotic heart disease of native coronary artery without angina pectoris: Secondary | ICD-10-CM

## 2023-06-19 DIAGNOSIS — I10 Essential (primary) hypertension: Secondary | ICD-10-CM

## 2023-06-19 DIAGNOSIS — Z131 Encounter for screening for diabetes mellitus: Secondary | ICD-10-CM | POA: Diagnosis not present

## 2023-06-19 DIAGNOSIS — Z79899 Other long term (current) drug therapy: Secondary | ICD-10-CM

## 2023-06-19 DIAGNOSIS — E785 Hyperlipidemia, unspecified: Secondary | ICD-10-CM

## 2023-06-19 DIAGNOSIS — Z860100 Personal history of colon polyps, unspecified: Secondary | ICD-10-CM

## 2023-06-19 DIAGNOSIS — I252 Old myocardial infarction: Secondary | ICD-10-CM

## 2023-06-19 DIAGNOSIS — Z125 Encounter for screening for malignant neoplasm of prostate: Secondary | ICD-10-CM

## 2023-06-19 LAB — CBC WITH DIFFERENTIAL/PLATELET
Basophils Absolute: 0 10*3/uL (ref 0.0–0.1)
Basophils Relative: 0.4 % (ref 0.0–3.0)
Eosinophils Absolute: 0.1 10*3/uL (ref 0.0–0.7)
Eosinophils Relative: 1 % (ref 0.0–5.0)
HCT: 48.1 % (ref 39.0–52.0)
Hemoglobin: 15.7 g/dL (ref 13.0–17.0)
Lymphocytes Relative: 25.7 % (ref 12.0–46.0)
Lymphs Abs: 1.4 10*3/uL (ref 0.7–4.0)
MCHC: 32.6 g/dL (ref 30.0–36.0)
MCV: 98.5 fL (ref 78.0–100.0)
Monocytes Absolute: 0.6 10*3/uL (ref 0.1–1.0)
Monocytes Relative: 10.3 % (ref 3.0–12.0)
Neutro Abs: 3.4 10*3/uL (ref 1.4–7.7)
Neutrophils Relative %: 62.6 % (ref 43.0–77.0)
Platelets: 168 10*3/uL (ref 150.0–400.0)
RBC: 4.88 Mil/uL (ref 4.22–5.81)
RDW: 13.7 % (ref 11.5–15.5)
WBC: 5.4 10*3/uL (ref 4.0–10.5)

## 2023-06-19 LAB — TSH: TSH: 2.55 u[IU]/mL (ref 0.35–5.50)

## 2023-06-19 LAB — HEMOGLOBIN A1C: Hgb A1c MFr Bld: 5.8 % (ref 4.6–6.5)

## 2023-06-19 LAB — PSA, MEDICARE: PSA: 1.07 ng/mL (ref 0.10–4.00)

## 2023-06-19 MED ORDER — LISINOPRIL 5 MG PO TABS: 5.0000 mg | ORAL_TABLET | Freq: Every day | ORAL | 1 refills | Status: DC

## 2023-06-19 MED ORDER — ROSUVASTATIN CALCIUM 40 MG PO TABS: 40.0000 mg | ORAL_TABLET | Freq: Every day | ORAL | 1 refills | Status: DC

## 2023-06-19 NOTE — Patient Instructions (Addendum)
Return in about 4 weeks (around 07/17/2023) for nurse visit - BP check.  Then 6 months for provider appt. On hypertension      Great to see you today.  I have refilled the medication(s) we provide.   If labs were collected or images ordered, we will inform you of  results once we have received them and reviewed. We will contact you either by echart message, or telephone call.  Please give ample time to the testing facility, and our office to run,  receive and review results. Please do not call inquiring of results, even if you can see them in your chart. We will contact you as soon as we are able. If it has been over 1 week since the test was completed, and you have not yet heard from [place], then please call us.    - echart message- for normal results that have been seen by the patient already.   - telephone call: abnormal results or if patient has not viewed results in their echart.  If a referral to a specialist was entered for you, please call us in 2 weeks if you have not heard from the specialist office to schedule.

## 2023-06-19 NOTE — Progress Notes (Signed)
Patient ID: [name] [name], male  DOB: 1951-02-03, 72 y.o.   MRN: [id number] Patient Care Team    Relationship Specialty Notifications Start End  [name] Leatherwood, DO PCP - General Family Medicine  05/23/22   [name] Bollman, MD PCP - Cardiology Cardiology Admissions 03/25/18   [name] Rist, MD Consulting Physician Gastroenterology  02/09/19     Chief Complaint  Patient presents with   Annual Exam    Pt is fasting; cmc; lipid & cmet completed 06/10/23; cbc completed 12/12/22    Subjective:  [name] [name] is a 72 y.o. male present for CPE and chronic condition management. All past medical history, surgical history, allergies, family history, immunizations, medications and social history were updated in the electronic medical record today. All recent labs, ED visits and hospitalizations within the last year were reviewed.  Health maintenance:  Colonoscopy: last screen 07/2019, recommend follow up 7 years; Completed by [name] [name]. Immunizations:  tdap  UTD 2020, influenza UTD 2024, PNA series completed, shingrix series completed Infectious disease screening: HIV declined, Hep C declined PSA:  Lab Results  Component Value Date   PSA 0.67 02/09/2019   PSA 1.10 02/13/2016  , pt was counseled on prostate cancer screenings.  Patient has a Dental home. Hospitalizations/ED visits: reviewed  Hypertension/hyperlipidemia/prior history of MI, aortic atherosclerosis/CAD: Pt reports compliance with Crestor. Blood pressures ranges at home not routinely checked.  Patient saw his cardiologist last week and blood pressures were prehypertensive.  His blood pressures have been steadily increasing over the last 1.5 years.  Patient denies chest pain, shortness of breath or lower extremity edema. [name] RF: Former smoker.  Prior MI.  Hyperlipidemia.  Patient follows  with cardiology/Dr. Excell [name]       11/07/2022    9:35 AM 05/23/2022    1:49 PM 02/09/2019    9:03 AM 05/09/2018    1:29 PM 05/27/2017   10:19 AM  Depression screen PHQ 2/9  Decreased Interest 0 0 0 0 0  Down, Depressed, Hopeless 0 0 0 0 0  PHQ - 2 Score 0 0 0 0 0          11/07/2022    9:37 AM 05/23/2022    1:50 PM 02/09/2019    9:03 AM 05/09/2018    1:29 PM  Fall Risk   Falls in the past year? 0 0 0 No  Number falls in past yr: 0 0    Injury with Fall? 0 0    Risk for fall due to : No Fall Risks No Fall Risks    Follow up Falls prevention discussed Falls evaluation completed Falls evaluation completed;Education provided;Falls prevention discussed     Immunization History  Administered Date(s) Administered   Fluad Quad(high Dose 65+) 06/01/2019, 07/01/2020, 05/23/2022   Influenza, High Dose Seasonal PF 05/09/2018   Influenza,inj,Quad PF,6+ Mos 07/02/2017   Influenza-Unspecified 06/04/2023   PFIZER(Purple Top)SARS-COV-2 Vaccination 10/08/2019, 11/02/2019   Pneumococcal Conjugate-13 07/02/2017   Pneumococcal Polysaccharide-23 05/09/2018   Tdap 02/09/2019   Unspecified SARS-COV-2 Vaccination 06/04/2023   Zoster Recombinant(Shingrix) 05/28/2018, 08/29/2018    Past Medical History:  Diagnosis Date   Chicken pox    Colon polyps    Coronary artery disease    COVID-19 05/2021   Emphysema of lung (HCC)    History of shingles    History of tobacco use  Hyperlipidemia    Hypertension    Myocardial infarction (HCC) 12/08/2007   stent   No Known Allergies Past Surgical History:  Procedure Laterality Date   CATARACT EXTRACTION, BILATERAL  05/2016   COLONOSCOPY  last 10/03/2015   CORONARY STENT PLACEMENT  2009   KNEE ARTHROSCOPY Left 1997   KNEE ARTHROSCOPY Right 2005   REVERSE SHOULDER ARTHROPLASTY Right 01/11/2022   Procedure: REVERSE SHOULDER ARTHROPLASTY;  Surgeon: [name] Hanly, MD;  Location: [facility];  Service: Orthopedics;  Laterality: Right;    VASECTOMY  1980    WISDOM TOOTH EXTRACTION  1980   Family History  Problem Relation Age of Onset   Heart disease Father    Heart disease Paternal Uncle    Heart disease Mother    Stroke Mother    Stroke Sister    Colon cancer Neg Hx    Rectal cancer Neg Hx    Stomach cancer Neg Hx    Esophageal cancer Neg Hx    Social History   Social History Narrative   Mr. [name] [name] lives with his wife and her 3 children. He works at [employer] & frequently lifts heavy objects.    Allergies as of 06/19/2023   No Known Allergies      Medication List        Accurate as of June 19, 2023 10:05 AM. If you have any questions, ask your nurse or doctor.          STOP taking these medications    Abrysvo 120 MCG/0.5ML injection Generic drug: RSV bivalent vaccine Stopped by: [name] [name]       TAKE these medications    aspirin 81 MG tablet Take 81 mg by mouth daily.   Co Q 10 100 MG Caps Take 100 mg by mouth daily.   lisinopril 5 MG tablet Commonly known as: ZESTRIL Take 1 tablet (5 mg total) by mouth daily. Started by: [name] [name]   rosuvastatin 40 MG tablet Commonly known as: CRESTOR Take 1 tablet (40 mg total) by mouth daily.       All past medical history, surgical history, allergies, family history, immunizations andmedications were updated in the EMR today and reviewed under the history and medication portions of their EMR.      ROS: 14 pt review of systems performed and negative (unless mentioned in an HPI)  Objective: BP (!) 140/90   Pulse (!) 57   Temp 97.7 F (36.5 C)   Ht 5\' 11"  (1.803 m)   Wt 172 lb 6.4 oz (78.2 kg)   SpO2 96%   BMI 24.04 kg/m  Physical Exam Constitutional:      General: He is not in acute distress.    Appearance: Normal appearance. He is not ill-appearing, toxic-appearing or diaphoretic.  HENT:     Head: Normocephalic and atraumatic.     Right Ear: Tympanic membrane, ear canal and external ear normal. There is no impacted cerumen.      Left Ear: Tympanic membrane, ear canal and external ear normal. There is no impacted cerumen.     Nose: Nose normal. No congestion or rhinorrhea.     Mouth/Throat:     Mouth: Mucous membranes are moist.     Pharynx: Oropharynx is clear. No oropharyngeal exudate or posterior oropharyngeal erythema.  Eyes:     General: No scleral icterus.       Right eye: No discharge.        Left eye: No discharge.     Extraocular  Movements: Extraocular movements intact.     Pupils: Pupils are equal, round, and reactive to light.  Cardiovascular:     Rate and Rhythm: Normal rate and regular rhythm.     Pulses: Normal pulses.     Heart sounds: Normal heart sounds. No murmur heard.    No friction rub. No gallop.  Pulmonary:     Effort: Pulmonary effort is normal. No respiratory distress.     Breath sounds: Normal breath sounds. No stridor. No wheezing, rhonchi or rales.  Chest:     Chest wall: No tenderness.  Abdominal:     General: Abdomen is flat. Bowel sounds are normal. There is no distension.     Palpations: Abdomen is soft. There is no mass.     Tenderness: There is no abdominal tenderness. There is no right CVA tenderness, left CVA tenderness, guarding or rebound.     Hernia: No hernia is present.  Musculoskeletal:        General: No swelling or tenderness. Normal range of motion.     Cervical back: Normal range of motion and neck supple.     Right lower leg: No edema.     Left lower leg: No edema.  Lymphadenopathy:     Cervical: No cervical adenopathy.  Skin:    General: Skin is warm and dry.     Coloration: Skin is not jaundiced.     Findings: No bruising, lesion or rash.  Neurological:     General: No focal deficit present.     Mental Status: He is alert and oriented to person, place, and time. Mental status is at baseline.     Cranial Nerves: No cranial nerve deficit.     Sensory: No sensory deficit.     Motor: No weakness.     Coordination: Coordination normal.     Gait: Gait  normal.     Deep Tendon Reflexes: Reflexes normal.  Psychiatric:        Mood and Affect: Mood normal.        Behavior: Behavior normal.        Thought Content: Thought content normal.        Judgment: Judgment normal.    No results found.  Assessment/plan: [name] [name] is a 72 y.o. male present for CPE and chronic condition management Encounter for preventive health examination Patient was encouraged to exercise greater than 150 minutes a week. Patient was encouraged to choose a diet filled with fresh fruits and vegetables, and lean meats. AVS provided to patient today for education/recommendation on gender specific health and safety maintenance. Colonoscopy: last screen 07/2019, recommend follow up 7 years; Completed by [name] [name]. Immunizations:  tdap  UTD 2020, influenza UTD 2024, PNA series completed, shingrix series completed Infectious disease screening: HIV declined, Hep C declined CBC, TSH collected today - Hemoglobin A1c  Diabetes screening: A1c collected today Prostate cancer screening: PSA collected today  Hypertension/atherosclerosis of native coronary artery of native heart without angina pectoris/History of MI (myocardial infarction)/Hyperlipidemia LDL goal <100/on statin therapy.  Aortic atherosclerosis (HCC) Blood pressure above goal on multiple readings over the last 1.5 years Start lisinopril 5 mg daily Continue Crestor 40 mg daily Continue baby aspirin daily Continue daily exercise Continue low-sodium diet -Continue routine follow-ups by cardiology, Dr. Excell [name] -Reviewed lipids and CMP collected last week at cardiology - Vital signs; Future  Follow-up in 4 weeks-nurse visit for BP recheck, order placed. Follow-up 6 months with provider, sooner if needed  Pulmonary emphysema, unspecified emphysema  type [facility]) - asymptomatic. Prior h/o smoking. Quit when he had his MI. Bikes daily.   Return in about 4 weeks (around 07/17/2023) for nurse visit - BP  check.  Orders Placed This Encounter  Procedures   CBC with Differential/Platelet   Hemoglobin A1c   TSH   PSA, Medicare   Vital signs    Standing Status:   Future    Standing Expiration Date:   08/19/2023     Note is dictated utilizing voice recognition software. Although note has been proof read prior to signing, occasional typographical errors still can be missed. If any questions arise, please do not hesitate to call for verification.  Electronically signed by: [name] Pacini, DO [facility] [place]

## 2023-07-17 ENCOUNTER — Ambulatory Visit: Payer: Medicare Other

## 2023-07-17 VITALS — BP 130/80 | HR 68

## 2023-07-17 DIAGNOSIS — I10 Essential (primary) hypertension: Secondary | ICD-10-CM | POA: Diagnosis not present

## 2023-07-17 NOTE — Progress Notes (Signed)
[name] [name] is a 72 y.o. male presents to the office today for Blood pressure recheck secondary to regimen change.  Blood pressure medication: lisinopril 5 mg  (med, dose, frequency) If on medication, Last dose was at least 1-2 hours prior to recheck: Yes Blood pressure was taken in the left arm after patient rested for 5 minutes.  BP 130/80   Pulse 68   Filomena Jungling

## 2023-07-17 NOTE — Progress Notes (Signed)
Spoke with pt about results

## 2023-07-17 NOTE — Progress Notes (Addendum)
Blood pressure at goal.  Continue lisinopril 5 mg every day.   BP 130/80   Pulse 68   Electronically Signed by: Felix Pacini, DO [facility]

## 2023-07-17 NOTE — Addendum Note (Signed)
Addended by: Felix Pacini A on: 07/17/2023 01:48 PM   Modules accepted: Orders, Level of Service

## 2023-11-13 ENCOUNTER — Ambulatory Visit (INDEPENDENT_AMBULATORY_CARE_PROVIDER_SITE_OTHER): Admitting: *Deleted

## 2023-11-13 DIAGNOSIS — Z0000 Encounter for general adult medical examination without abnormal findings: Secondary | ICD-10-CM | POA: Diagnosis not present

## 2023-11-13 NOTE — Progress Notes (Signed)
 Subjective:   [name] [name] is a 73 y.o. male who presents for Medicare Annual/Subsequent preventive examination.  Visit Complete: Virtual I connected with  [name] [name] on 11/13/23 by a audio enabled telemedicine application and verified that I am speaking with the correct person using two identifiers.  Patient Location: Home  Provider Location: Home Office  I discussed the limitations of evaluation and management by telemedicine. The patient expressed understanding and agreed to proceed.  Vital Signs: Because this visit was a virtual/telehealth visit, some criteria may be missing or patient reported. Any vitals not documented were not able to be obtained and vitals that have been documented are patient reported.   Cardiac Risk Factors include: advanced age (>37men, >44 women);male gender;hypertension     Objective:    There were no vitals filed for this visit. There is no height or weight on file to calculate BMI.     11/13/2023   11:51 AM 11/07/2022    9:36 AM 01/04/2022    2:08 PM 07/16/2016   12:47 PM  Advanced Directives  Does Patient Have a Medical Advance Directive? Yes Yes No No  Type of Advance Directive Living will Healthcare Power of [place];Living will    Copy of Healthcare Power of Attorney in Chart?  No - copy requested    Would patient like information on creating a medical advance directive?   No - Patient declined     Current Medications (verified) Outpatient Encounter Medications as of 11/13/2023  Medication Sig   aspirin 81 MG tablet Take 81 mg by mouth daily.     Coenzyme Q10 (CO Q 10) 100 MG CAPS Take 100 mg by mouth daily.   lisinopril (ZESTRIL) 5 MG tablet Take 1 tablet (5 mg total) by mouth daily.   rosuvastatin (CRESTOR) 40 MG tablet Take 1 tablet (40 mg total) by mouth daily.   No facility-administered encounter medications on file as of 11/13/2023.    Allergies (verified) Patient has no known allergies.   History: Past Medical  History:  Diagnosis Date   Chicken pox    Colon polyps    Coronary artery disease    COVID-19 05/2021   Emphysema of lung (HCC)    History of shingles    History of tobacco use    Hyperlipidemia    Hypertension    Myocardial infarction (HCC) 12/08/2007   stent   Past Surgical History:  Procedure Laterality Date   CATARACT EXTRACTION, BILATERAL  05/2016   COLONOSCOPY  last 10/03/2015   CORONARY STENT PLACEMENT  2009   KNEE ARTHROSCOPY Left 1997   KNEE ARTHROSCOPY Right 2005   REVERSE SHOULDER ARTHROPLASTY Right 01/11/2022   Procedure: REVERSE SHOULDER ARTHROPLASTY;  Surgeon: Francena Hanly, MD;  Location: [facility];  Service: Orthopedics;  Laterality: Right;    VASECTOMY  1980   WISDOM TOOTH EXTRACTION  1980   Family History  Problem Relation Age of Onset   Heart disease Father    Heart disease Paternal Uncle    Heart disease Mother    Stroke Mother    Stroke Sister    Colon cancer Neg Hx    Rectal cancer Neg Hx    Stomach cancer Neg Hx    Esophageal cancer Neg Hx    Social History   Socioeconomic History   Marital status: Married    Spouse name: Not on file   Number of children: Not on file   Years of education: 12   Highest education level: Not  on file  Occupational History   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  Tobacco Use   Smoking status: Former    Current packs/day: 0.00    Average packs/day: 1 pack/day for 40.0 years (40.0 ttl pk-yrs)    Types: Cigarettes    Start date: 12/03/1967    Quit date: 12/03/2007    Years since quitting: 15.9   Smokeless tobacco: Never  Vaping Use   Vaping status: Never Used  Substance and Sexual Activity   Alcohol use: No   Drug use: No   Sexual activity: Yes  Other Topics Concern   Not on file  Social History Narrative   [name] lives with his wife and her 3 children. He works at [employer] & frequently lifts heavy objects.   Social Drivers of Corporate investment banker  Strain: Low Risk  (11/13/2023)   Overall Financial Resource Strain (CARDIA)    Difficulty of Paying Living Expenses: Not hard at all  Food Insecurity: No Food Insecurity (11/13/2023)   Hunger Vital Sign    Worried About Running Out of Food in the Last Year: Never true    Ran Out of Food in the Last Year: Never true  Transportation Needs: No Transportation Needs (11/13/2023)   PRAPARE - Administrator, Civil Service (Medical): No    Lack of Transportation (Non-Medical): No  Physical Activity: Sufficiently Active (11/13/2023)   Exercise Vital Sign    Days of Exercise per Week: 4 days    Minutes of Exercise per Session: 50 min  Stress: No Stress Concern Present (11/13/2023)   [employer] of Occupational Health - Occupational Stress Questionnaire    Feeling of Stress : Not at all  Social Connections: Socially Integrated (11/13/2023)   Social Connection and Isolation Panel [NHANES]    Frequency of Communication with Friends and Family: More than three times a week    Frequency of Social Gatherings with Friends and Family: More than three times a week    Attends Religious Services: More than 4 times per year    Active Member of [employer] or Organizations: Yes    Attends Engineer, structural: More than 4 times per year    Marital Status: Married    Tobacco Counseling Counseling given: Not Answered   Clinical Intake:  Pre-visit preparation completed: Yes  Pain : No/denies pain     Diabetes: No     Interpreter Needed?: No  Information entered by :: Remi Haggard LPN   Activities of Daily Living    11/13/2023   11:50 AM  In your present state of health, do you have any difficulty performing the following activities:  Hearing? 0  Vision? 0  Difficulty concentrating or making decisions? 0  Walking or climbing stairs? 0  Dressing or bathing? 0  Doing errands, shopping? 0  Preparing Food and eating ? N  Using the Toilet? N  In the past six months, have you  accidently leaked urine? N  Do you have problems with loss of bowel control? N  Managing your Medications? N  Managing your Finances? N  Housekeeping or managing your Housekeeping? N    Patient Care Team: Natalia Leatherwood, DO as PCP - General (Family Medicine) Tonny Bollman, MD as PCP - Cardiology (Cardiology) Myrtie Neither Andreas Blower, MD as Consulting Physician (Gastroenterology)  Indicate any recent Medical Services you may have received from other than [facility] providers in the past year (date may  be approximate).     Assessment:   This is a routine wellness examination for Travian.  Hearing/Vision screen Hearing Screening - Comments:: No trouble hearing Vision Screening - Comments:: Up to date [facility]   Goals Addressed             This Visit's Progress    Patient Stated   On track    Maintain health      Patient Stated       Maintain current lifestyle       Depression Screen    11/13/2023   11:52 AM 11/07/2022    9:35 AM 05/23/2022    1:49 PM 02/09/2019    9:03 AM 05/09/2018    1:29 PM 05/27/2017   10:19 AM  PHQ 2/9 Scores  PHQ - 2 Score 0 0 0 0 0 0  PHQ- 9 Score 0         Fall Risk    11/13/2023   11:48 AM 11/07/2022    9:37 AM 05/23/2022    1:50 PM 02/09/2019    9:03 AM 05/09/2018    1:29 PM  Fall Risk   Falls in the past year? 0 0 0 0 No  Number falls in past yr: 0 0 0    Injury with Fall? 0 0 0    Risk for fall due to :  No Fall Risks No Fall Risks    Follow up Falls evaluation completed;Education provided;Falls prevention discussed Falls prevention discussed Falls evaluation completed Falls evaluation completed;Education provided;Falls prevention discussed     MEDICARE RISK AT HOME: Medicare Risk at Home Any stairs in or around the home?: Yes If so, are there any without handrails?: No Home free of loose throw rugs in walkways, pet beds, electrical cords, etc?: Yes Adequate lighting in your home to reduce risk of falls?: Yes Life alert?: No Use of a cane, walker  or w/c?: No Grab bars in the bathroom?: No Shower chair or bench in shower?: No Elevated toilet seat or a handicapped toilet?: No  TIMED UP AND GO:  Was the test performed?  No    Cognitive Function:        11/13/2023   11:52 AM 11/07/2022    9:38 AM  6CIT Screen  What Year? 0 points 0 points  What month? 0 points 0 points  What time? 0 points 0 points  Count back from 20 0 points 0 points  Months in reverse 0 points 0 points  Repeat phrase 0 points 0 points  Total Score 0 points 0 points    Immunizations Immunization History  Administered Date(s) Administered   Fluad Quad(high Dose 65+) 06/01/2019, 07/01/2020, 05/23/2022   Influenza, High Dose Seasonal PF 05/09/2018   Influenza,inj,Quad PF,6+ Mos 07/02/2017   Influenza-Unspecified 06/04/2023   PFIZER(Purple Top)SARS-COV-2 Vaccination 10/08/2019, 11/02/2019   PNEUMOCOCCAL CONJUGATE-20 09/30/2023   Pneumococcal Conjugate-13 07/02/2017   Pneumococcal Polysaccharide-23 05/09/2018   Tdap 02/09/2019   Unspecified SARS-COV-2 Vaccination 06/04/2023   Zoster Recombinant(Shingrix) 05/28/2018, 08/29/2018    TDAP status: Up to date  Flu Vaccine status: Up to date  Pneumococcal vaccine status: Up to date  Covid-19 vaccine status: Information provided on how to obtain vaccines.   Qualifies for Shingles Vaccine? No   Zostavax completed Yes   Shingrix Completed?: No.    Education has been provided regarding the importance of this vaccine. Patient has been advised to call insurance company to determine out of pocket expense if they have not yet received this vaccine. Advised  may also receive vaccine at local pharmacy or Health Dept. Verbalized acceptance and understanding.  Screening Tests Health Maintenance  Topic Date Due   Medicare Annual Wellness (AWV)  11/12/2024   Colonoscopy  07/12/2026   DTaP/Tdap/Td (2 - Td or Tdap) 02/08/2029   Pneumonia Vaccine 47+ Years old  Completed   INFLUENZA VACCINE  Completed   Zoster  Vaccines- Shingrix  Completed   HPV VACCINES  Aged Out   Lung Cancer Screening  Discontinued   COVID-19 Vaccine  Discontinued   Hepatitis C Screening  Discontinued    Health Maintenance  There are no preventive care reminders to display for this patient.   Colorectal cancer screening: Type of screening: Colonoscopy. Completed 2020. Repeat every 7 years  Lung Cancer Screening: (Low Dose CT Chest recommended if Age 82-80 years, 20 pack-year currently smoking OR have quit w/in 15years.)  Lung Cancer Screening Referral: last one 2018    Additional Screening:  Hepatitis C Screening  never done  Vision Screening: Recommended annual ophthalmology exams for early detection of glaucoma and other disorders of the eye. Is the patient up to date with their annual eye exam?  No  Who is the provider or what is the name of the office in which the patient attends annual eye exams? [facility] If pt is not established with a provider, would they like to be referred to a provider to establish care? No .   Dental Screening: Recommended annual dental exams for proper oral hygiene    Community Resource Referral / Chronic Care Management: CRR required this visit?  No   CCM required this visit?  No     Plan:     I have personally reviewed and noted the following in the patient's chart:   Medical and social history Use of alcohol, tobacco or illicit drugs  Current medications and supplements including opioid prescriptions. Patient is not currently taking opioid prescriptions. Functional ability and status Nutritional status Physical activity Advanced directives List of other physicians Hospitalizations, surgeries, and ER visits in previous 12 months Vitals Screenings to include cognitive, depression, and falls Referrals and appointments  In addition, I have reviewed and discussed with patient certain preventive protocols, quality metrics, and best practice recommendations. A written  personalized care plan for preventive services as well as general preventive health recommendations were provided to patient.     Remi Haggard, LPN   9/81/1914   After Visit Summary: (MyChart) Due to this being a telephonic visit, the after visit summary with patients personalized plan was offered to patient via MyChart   Nurse Notes:

## 2023-11-13 NOTE — Patient Instructions (Signed)
 Mr. [name] [name] , Thank you for taking time to come for your Medicare Wellness Visit. I appreciate your ongoing commitment to your health goals. Please review the following plan we discussed and let me know if I can assist you in the future.   Screening recommendations/referrals: Colonoscopy: up to date Recommended yearly ophthalmology/optometry visit for glaucoma screening and checkup Recommended yearly dental visit for hygiene and checkup  Vaccinations: Influenza vaccine: up to date Pneumococcal vaccine: up to date Tdap vaccine: up to date Shingles vaccine: up to date       Preventive Care 65 Years and Older, Male Preventive care refers to lifestyle choices and visits with your health care provider that can promote health and wellness. What does preventive care include? A yearly physical exam. This is also called an annual well check. Dental exams once or twice a year. Routine eye exams. Ask your health care provider how often you should have your eyes checked. Personal lifestyle choices, including: Daily care of your teeth and gums. Regular physical activity. Eating a healthy diet. Avoiding tobacco and drug use. Limiting alcohol use. Practicing safe sex. Taking low doses of aspirin every day. Taking vitamin and mineral supplements as recommended by your health care provider. What happens during an annual well check? The services and screenings done by your health care provider during your annual well check will depend on your age, overall health, lifestyle risk factors, and family history of disease. Counseling  Your health care provider may ask you questions about your: Alcohol use. Tobacco use. Drug use. Emotional well-being. Home and relationship well-being. Sexual activity. Eating habits. History of falls. Memory and ability to understand (cognition). Work and work Astronomer. Screening  You may have the following tests or measurements: Height, weight, and  BMI. Blood pressure. Lipid and cholesterol levels. These may be checked every 5 years, or more frequently if you are over 66 years old. Skin check. Lung cancer screening. You may have this screening every year starting at age 15 if you have a 30-pack-year history of smoking and currently smoke or have quit within the past 15 years. Fecal occult blood test (FOBT) of the stool. You may have this test every year starting at age 2. Flexible sigmoidoscopy or colonoscopy. You may have a sigmoidoscopy every 5 years or a colonoscopy every 10 years starting at age 21. Prostate cancer screening. Recommendations will vary depending on your family history and other risks. Hepatitis C blood test. Hepatitis B blood test. Sexually transmitted disease (STD) testing. Diabetes screening. This is done by checking your blood sugar (glucose) after you have not eaten for a while (fasting). You may have this done every 1-3 years. Abdominal aortic aneurysm (AAA) screening. You may need this if you are a current or former smoker. Osteoporosis. You may be screened starting at age 33 if you are at high risk. Talk with your health care provider about your test results, treatment options, and if necessary, the need for more tests. Vaccines  Your health care provider may recommend certain vaccines, such as: Influenza vaccine. This is recommended every year. Tetanus, diphtheria, and acellular pertussis (Tdap, Td) vaccine. You may need a Td booster every 10 years. Zoster vaccine. You may need this after age 30. Pneumococcal 13-valent conjugate (PCV13) vaccine. One dose is recommended after age 85. Pneumococcal polysaccharide (PPSV23) vaccine. One dose is recommended after age 27. Talk to your health care provider about which screenings and vaccines you need and how often you need them. This information is not  intended to replace advice given to you by your health care provider. Make sure you discuss any questions you have  with your health care provider. Document Released: 09/16/2015 Document Revised: 05/09/2016 Document Reviewed: 06/21/2015 Elsevier Interactive Patient Education  2017 [employer].  Fall Prevention in the Home Falls can cause injuries. They can happen to people of all ages. There are many things you can do to make your home safe and to help prevent falls. What can I do on the outside of my home? Regularly fix the edges of walkways and driveways and fix any cracks. Remove anything that might make you trip as you walk through a door, such as a raised step or threshold. Trim any bushes or trees on the path to your home. Use bright outdoor lighting. Clear any walking paths of anything that might make someone trip, such as rocks or tools. Regularly check to see if handrails are loose or broken. Make sure that both sides of any steps have handrails. Any raised decks and porches should have guardrails on the edges. Have any leaves, snow, or ice cleared regularly. Use sand or salt on walking paths during winter. Clean up any spills in your garage right away. This includes oil or grease spills. What can I do in the bathroom? Use night lights. Install grab bars by the toilet and in the tub and shower. Do not use towel bars as grab bars. Use non-skid mats or decals in the tub or shower. If you need to sit down in the shower, use a plastic, non-slip stool. Keep the floor dry. Clean up any water that spills on the floor as soon as it happens. Remove soap buildup in the tub or shower regularly. Attach bath mats securely with double-sided non-slip rug tape. Do not have throw rugs and other things on the floor that can make you trip. What can I do in the bedroom? Use night lights. Make sure that you have a light by your bed that is easy to reach. Do not use any sheets or blankets that are too big for your bed. They should not hang down onto the floor. Have a firm chair that has side arms. You can use  this for support while you get dressed. Do not have throw rugs and other things on the floor that can make you trip. What can I do in the kitchen? Clean up any spills right away. Avoid walking on wet floors. Keep items that you use a lot in easy-to-reach places. If you need to reach something above you, use a strong step stool that has a grab bar. Keep electrical cords out of the way. Do not use floor polish or wax that makes floors slippery. If you must use wax, use non-skid floor wax. Do not have throw rugs and other things on the floor that can make you trip. What can I do with my stairs? Do not leave any items on the stairs. Make sure that there are handrails on both sides of the stairs and use them. Fix handrails that are broken or loose. Make sure that handrails are as long as the stairways. Check any carpeting to make sure that it is firmly attached to the stairs. Fix any carpet that is loose or worn. Avoid having throw rugs at the top or bottom of the stairs. If you do have throw rugs, attach them to the floor with carpet tape. Make sure that you have a light switch at the top of the stairs  and the bottom of the stairs. If you do not have them, ask someone to add them for you. What else can I do to help prevent falls? Wear shoes that: Do not have high heels. Have rubber bottoms. Are comfortable and fit you well. Are closed at the toe. Do not wear sandals. If you use a stepladder: Make sure that it is fully opened. Do not climb a closed stepladder. Make sure that both sides of the stepladder are locked into place. Ask someone to hold it for you, if possible. Clearly mark and make sure that you can see: Any grab bars or handrails. First and last steps. Where the edge of each step is. Use tools that help you move around (mobility aids) if they are needed. These include: Canes. Walkers. Scooters. Crutches. Turn on the lights when you go into a dark area. Replace any light bulbs  as soon as they burn out. Set up your furniture so you have a clear path. Avoid moving your furniture around. If any of your floors are uneven, fix them. If there are any pets around you, be aware of where they are. Review your medicines with your doctor. Some medicines can make you feel dizzy. This can increase your chance of falling. Ask your doctor what other things that you can do to help prevent falls. This information is not intended to replace advice given to you by your health care provider. Make sure you discuss any questions you have with your health care provider. Document Released: 06/16/2009 Document Revised: 01/26/2016 Document Reviewed: 09/24/2014 Elsevier Interactive Patient Education  2017 [employer].

## 2023-12-02 DIAGNOSIS — H40023 Open angle with borderline findings, high risk, bilateral: Secondary | ICD-10-CM | POA: Diagnosis not present

## 2023-12-08 ENCOUNTER — Encounter: Payer: Self-pay | Admitting: Family Medicine

## 2023-12-09 ENCOUNTER — Other Ambulatory Visit: Payer: Self-pay

## 2023-12-09 MED ORDER — LISINOPRIL 5 MG PO TABS: 5.0000 mg | ORAL_TABLET | Freq: Every day | ORAL | 0 refills | Status: DC

## 2023-12-12 ENCOUNTER — Other Ambulatory Visit: Payer: Self-pay | Admitting: Family Medicine

## 2023-12-29 ENCOUNTER — Other Ambulatory Visit: Payer: Self-pay | Admitting: Family Medicine

## 2023-12-30 ENCOUNTER — Other Ambulatory Visit: Payer: Self-pay | Admitting: Family Medicine

## 2024-01-01 ENCOUNTER — Encounter: Payer: Self-pay | Admitting: Family Medicine

## 2024-01-01 ENCOUNTER — Ambulatory Visit (INDEPENDENT_AMBULATORY_CARE_PROVIDER_SITE_OTHER): Admitting: Family Medicine

## 2024-01-01 VITALS — BP 118/64 | HR 60 | Temp 97.8°F | Wt 173.0 lb

## 2024-01-01 DIAGNOSIS — I252 Old myocardial infarction: Secondary | ICD-10-CM | POA: Diagnosis not present

## 2024-01-01 DIAGNOSIS — I2510 Atherosclerotic heart disease of native coronary artery without angina pectoris: Secondary | ICD-10-CM | POA: Diagnosis not present

## 2024-01-01 DIAGNOSIS — I700 Atherosclerosis of aorta: Secondary | ICD-10-CM | POA: Diagnosis not present

## 2024-01-01 DIAGNOSIS — E785 Hyperlipidemia, unspecified: Secondary | ICD-10-CM

## 2024-01-01 DIAGNOSIS — Z79899 Other long term (current) drug therapy: Secondary | ICD-10-CM | POA: Diagnosis not present

## 2024-01-01 DIAGNOSIS — I10 Essential (primary) hypertension: Secondary | ICD-10-CM

## 2024-01-01 MED ORDER — ROSUVASTATIN CALCIUM 40 MG PO TABS: 40.0000 mg | ORAL_TABLET | Freq: Every day | ORAL | 1 refills | Status: DC

## 2024-01-01 MED ORDER — LISINOPRIL 5 MG PO TABS: 5.0000 mg | ORAL_TABLET | Freq: Every day | ORAL | 1 refills | Status: DC

## 2024-01-01 NOTE — Patient Instructions (Addendum)
 Return in about 6 months (around 06/19/2024) for cpe (20 min), Routine chronic condition follow-up.        Great to see you today.  I have refilled the medication(s) we provide.   If labs were collected or images ordered, we will inform you of  results once we have received them and reviewed. We will contact you either by echart message, or telephone call.  Please give ample time to the testing facility, and our office to run,  receive and review results. Please do not call inquiring of results, even if you can see them in your chart. We will contact you as soon as we are able. If it has been over 1 week since the test was completed, and you have not yet heard from [place], then please call us.    - echart message- for normal results that have been seen by the patient already.   - telephone call: abnormal results or if patient has not viewed results in their echart.  If a referral to a specialist was entered for you, please call us in 2 weeks if you have not heard from the specialist office to schedule. ]

## 2024-01-01 NOTE — Progress Notes (Signed)
 Patient ID: [name] [name], male  DOB: 1951/03/24, 73 y.o.   MRN: [id number] Patient Care Team    Relationship Specialty Notifications Start End  Mariel Shope, DO PCP - General Family Medicine  05/23/22   Arnoldo Lapping, MD PCP - Cardiology Cardiology Admissions 03/25/18   Albertina Hugger, MD Consulting Physician Gastroenterology  02/09/19     Chief Complaint  Patient presents with   Medical Management of Chronic Issues    [facility]    Subjective:  [name] [name] is a 73 y.o. male present for chronic condition management. All past medical history, surgical history, allergies, family history, immunizations, medications and social history were updated in the electronic medical record today. All recent labs, ED visits and hospitalizations within the last year were reviewed.  Hypertension/hyperlipidemia/prior history of MI, aortic atherosclerosis/CAD: Pt reports compliance with lisinopril  5 mg and Crestor .  Pt takes a daily baby ASA. Pt is  prescribed statin. Patient denies chest pain, shortness of breath, dizziness or lower extremity edema.   Diet: Patient follows a low-sodium diet Exercise: Patient exercises routinely RF: Former smoker.  Prior MI.  Hyperlipidemia.  Patient follows with cardiology/Dr. Arlester Ladd       01/01/2024    7:40 AM 11/13/2023   11:52 AM 11/07/2022    9:35 AM 05/23/2022    1:49 PM 02/09/2019    9:03 AM  Depression screen PHQ 2/9  Decreased Interest 0 0 0 0 0  Down, Depressed, Hopeless 0 0 0 0 0  PHQ - 2 Score 0 0 0 0 0  Altered sleeping  0     Tired, decreased energy  0     Change in appetite  0     Feeling bad or failure about yourself   0     Trouble concentrating  0     Moving slowly or fidgety/restless  0     Suicidal thoughts  0     PHQ-9 Score  0     Difficult doing work/chores  Not difficult at all             01/01/2024    7:40 AM 11/13/2023   11:48 AM 11/07/2022    9:37 AM 05/23/2022    1:50 PM 02/09/2019    9:03 AM  Fall Risk   Falls  in the past year? 0 0 0 0 0  Number falls in past yr: 0 0 0 0   Injury with Fall? 0 0 0 0   Risk for fall due to : No Fall Risks  No Fall Risks No Fall Risks   Follow up Falls evaluation completed Falls evaluation completed;Education provided;Falls prevention discussed Falls prevention discussed Falls evaluation completed Falls evaluation completed;Education provided;Falls prevention discussed    Immunization History  Administered Date(s) Administered   Fluad Quad(high Dose 65+) 06/01/2019, 07/01/2020, 05/23/2022   Influenza, High Dose Seasonal PF 05/09/2018   Influenza,inj,Quad PF,6+ Mos 07/02/2017   Influenza-Unspecified 06/04/2023   PFIZER(Purple Top)SARS-COV-2 Vaccination 10/08/2019, 11/02/2019   PNEUMOCOCCAL CONJUGATE-20 09/30/2023   Pneumococcal Conjugate-13 07/02/2017   Pneumococcal Polysaccharide-23 05/09/2018   Tdap 02/09/2019   Unspecified SARS-COV-2 Vaccination 06/04/2023   Zoster Recombinant(Shingrix) 05/28/2018, 08/29/2018    Past Medical History:  Diagnosis Date   Chicken pox    Colon polyps    Coronary artery disease    COVID-19 05/2021   Emphysema of lung (HCC)    History of shingles    History of tobacco use    HIV screening declined  02/09/2019   Hyperlipidemia    Hypertension    Myocardial infarction (HCC) 12/08/2007   stent   No Known Allergies Past Surgical History:  Procedure Laterality Date   CATARACT EXTRACTION, BILATERAL  05/2016   COLONOSCOPY  last 10/03/2015   CORONARY STENT PLACEMENT  2009   KNEE ARTHROSCOPY Left 1997   KNEE ARTHROSCOPY Right 2005   REVERSE SHOULDER ARTHROPLASTY Right 01/11/2022   Procedure: REVERSE SHOULDER ARTHROPLASTY;  Surgeon: Ellard Gunning, MD;  Location: [facility];  Service: Orthopedics;  Laterality: Right;    VASECTOMY  1980   WISDOM TOOTH EXTRACTION  1980   Family History  Problem Relation Age of Onset   Heart disease Father    Heart disease Paternal Uncle    Heart disease Mother    Stroke Mother    Stroke  Sister    Colon cancer Neg Hx    Rectal cancer Neg Hx    Stomach cancer Neg Hx    Esophageal cancer Neg Hx    Social History   Social History Narrative   [name] [name] lives with his wife and her 3 children. He works at [employer] & frequently lifts heavy objects.    Allergies as of 01/01/2024   No Known Allergies      Medication List        Accurate as of January 01, 2024  7:47 AM. If you have any questions, ask your nurse or doctor.          aspirin 81 MG tablet Take 81 mg by mouth daily.   Co Q 10 100 MG Caps Take 100 mg by mouth daily.   lisinopril  5 MG tablet Commonly known as: ZESTRIL  Take 1 tablet (5 mg total) by mouth daily.   rosuvastatin  40 MG tablet Commonly known as: CRESTOR  Take 1 tablet (40 mg total) by mouth daily.       All past medical history, surgical history, allergies, family history, immunizations andmedications were updated in the EMR today and reviewed under the history and medication portions of their EMR.      ROS: 14 pt review of systems performed and negative (unless mentioned in an HPI)  Objective: BP 118/64   Pulse 60   Temp 97.8 F (36.6 C)   Wt 173 lb (78.5 kg)   SpO2 98%   BMI 24.13 kg/m  Physical Exam Vitals and nursing note reviewed.  Constitutional:      General: He is not in acute distress.    Appearance: Normal appearance. He is not ill-appearing, toxic-appearing or diaphoretic.  HENT:     Head: Normocephalic and atraumatic.  Eyes:     General: No scleral icterus.       Right eye: No discharge.        Left eye: No discharge.     Extraocular Movements: Extraocular movements intact.     Pupils: Pupils are equal, round, and reactive to light.  Cardiovascular:     Rate and Rhythm: Normal rate and regular rhythm.     Heart sounds: No murmur heard. Pulmonary:     Effort: Pulmonary effort is normal. No respiratory distress.     Breath sounds: Normal breath sounds. No wheezing, rhonchi or rales.   Musculoskeletal:     Right lower leg: No edema.     Left lower leg: No edema.  Skin:    General: Skin is warm.     Findings: No rash.  Neurological:     Mental Status: He is alert and  oriented to person, place, and time. Mental status is at baseline.  Psychiatric:        Mood and Affect: Mood normal.        Behavior: Behavior normal.        Thought Content: Thought content normal.        Judgment: Judgment normal.    No results found.  Assessment/plan: [name] [name] is a 73 y.o. male present for chronic condition management Hypertension/atherosclerosis of native coronary artery of native heart without angina pectoris/History of MI (myocardial infarction)/Hyperlipidemia LDL goal <100/on statin therapy.  Stable Continue lisinopril  5 mg daily Continue Crestor  40 mg daily Continue baby aspirin daily Continue daily exercise Continue low-sodium diet -Continue routine follow-ups by cardiology, Dr. Arlester Ladd  Pulmonary emphysema, unspecified emphysema type (HCC) - asymptomatic. Prior h/o smoking. Quit when he had his MI. Bikes daily.   Return in about 6 months (around 06/19/2024) for cpe (20 min), Routine chronic condition follow-up.  No orders of the defined types were placed in this encounter.    Note is dictated utilizing voice recognition software. Although note has been proof read prior to signing, occasional typographical errors still can be missed. If any questions arise, please do not hesitate to call for verification.  Electronically signed by: Napolean Backbone, DO [facility]

## 2024-03-13 ENCOUNTER — Encounter: Payer: Self-pay | Admitting: Cardiovascular Disease

## 2024-06-24 ENCOUNTER — Encounter: Payer: Self-pay | Admitting: Family Medicine

## 2024-06-24 NOTE — Telephone Encounter (Signed)
 Patient is scheduled for his physical next week, we will discuss this at his physical.  I am not certain what he is referring to as comprehensive blood work.   Preventative labs for yearly physicals usually include a CMP and lipids which is typically all at cardiology tests.

## 2024-06-25 ENCOUNTER — Other Ambulatory Visit: Payer: Self-pay | Admitting: Family Medicine

## 2024-06-29 ENCOUNTER — Ambulatory Visit (INDEPENDENT_AMBULATORY_CARE_PROVIDER_SITE_OTHER): Admitting: Family Medicine

## 2024-06-29 ENCOUNTER — Encounter: Payer: Self-pay | Admitting: Family Medicine

## 2024-06-29 VITALS — BP 122/80 | HR 69 | Temp 97.9°F | Ht 71.0 in | Wt 170.8 lb

## 2024-06-29 DIAGNOSIS — I700 Atherosclerosis of aorta: Secondary | ICD-10-CM

## 2024-06-29 DIAGNOSIS — Z860100 Personal history of colon polyps, unspecified: Secondary | ICD-10-CM | POA: Diagnosis not present

## 2024-06-29 DIAGNOSIS — I2510 Atherosclerotic heart disease of native coronary artery without angina pectoris: Secondary | ICD-10-CM

## 2024-06-29 DIAGNOSIS — Z131 Encounter for screening for diabetes mellitus: Secondary | ICD-10-CM | POA: Diagnosis not present

## 2024-06-29 DIAGNOSIS — Z125 Encounter for screening for malignant neoplasm of prostate: Secondary | ICD-10-CM | POA: Diagnosis not present

## 2024-06-29 DIAGNOSIS — I252 Old myocardial infarction: Secondary | ICD-10-CM | POA: Diagnosis not present

## 2024-06-29 DIAGNOSIS — Z79899 Other long term (current) drug therapy: Secondary | ICD-10-CM

## 2024-06-29 DIAGNOSIS — I10 Essential (primary) hypertension: Secondary | ICD-10-CM

## 2024-06-29 DIAGNOSIS — Z0000 Encounter for general adult medical examination without abnormal findings: Secondary | ICD-10-CM | POA: Diagnosis not present

## 2024-06-29 DIAGNOSIS — Z23 Encounter for immunization: Secondary | ICD-10-CM | POA: Diagnosis not present

## 2024-06-29 DIAGNOSIS — J439 Emphysema, unspecified: Secondary | ICD-10-CM

## 2024-06-29 DIAGNOSIS — E785 Hyperlipidemia, unspecified: Secondary | ICD-10-CM

## 2024-06-29 LAB — COMPREHENSIVE METABOLIC PANEL WITH GFR
ALT: 24 U/L (ref 0–53)
AST: 22 U/L (ref 0–37)
Albumin: 4.1 g/dL (ref 3.5–5.2)
Alkaline Phosphatase: 62 U/L (ref 39–117)
BUN: 16 mg/dL (ref 6–23)
CO2: 29 meq/L (ref 19–32)
Calcium: 9.2 mg/dL (ref 8.4–10.5)
Chloride: 103 meq/L (ref 96–112)
Creatinine, Ser: 0.92 mg/dL (ref 0.40–1.50)
GFR: 82.69 mL/min (ref >60.00)
Glucose, Bld: 84 mg/dL (ref 70–99)
Potassium: 4.6 meq/L (ref 3.5–5.1)
Sodium: 140 meq/L (ref 135–145)
Total Bilirubin: 0.6 mg/dL (ref 0.2–1.2)
Total Protein: 6.4 g/dL (ref 6.0–8.3)

## 2024-06-29 LAB — CBC
HCT: 45.6 % (ref 39.0–52.0)
Hemoglobin: 15.1 g/dL (ref 13.0–17.0)
MCHC: 33.2 g/dL (ref 30.0–36.0)
MCV: 97.4 fl (ref 78.0–100.0)
Platelets: 172 K/uL (ref 150.0–400.0)
RBC: 4.68 Mil/uL (ref 4.22–5.81)
RDW: 13.6 % (ref 11.5–15.5)
WBC: 5.4 K/uL (ref 4.0–10.5)

## 2024-06-29 LAB — LIPID PANEL
Cholesterol: 141 mg/dL (ref 0–200)
HDL: 63.3 mg/dL (ref >39.00)
LDL Cholesterol: 60 mg/dL (ref 0–99)
NonHDL: 77.97
Total CHOL/HDL Ratio: 2
Triglycerides: 89 mg/dL (ref 0.0–149.0)
VLDL: 17.8 mg/dL (ref 0.0–40.0)

## 2024-06-29 LAB — TSH: TSH: 2.55 u[IU]/mL (ref 0.35–5.50)

## 2024-06-29 LAB — PSA, MEDICARE: PSA: 1.19 ng/mL (ref 0.10–4.00)

## 2024-06-29 LAB — HEMOGLOBIN A1C: Hgb A1c MFr Bld: 5.9 % (ref 4.6–6.5)

## 2024-06-29 MED ORDER — LISINOPRIL 5 MG PO TABS: 5.0000 mg | ORAL_TABLET | Freq: Every day | ORAL | 1 refills | Status: AC

## 2024-06-29 MED ORDER — ROSUVASTATIN CALCIUM 40 MG PO TABS: 40.0000 mg | ORAL_TABLET | Freq: Every day | ORAL | 3 refills | Status: AC

## 2024-06-29 NOTE — Progress Notes (Signed)
 Patient ID: [name] [name], male  DOB: Jul 28, 1951, 73 y.o.   MRN: [id number] Patient Care Team    Relationship Specialty Notifications Start End  Catherine Charlies LABOR, DO PCP - General Family Medicine  05/23/22   Wonda Sharper, MD PCP - Cardiology Cardiology Admissions 03/25/18   Legrand Victory LITTIE DOUGLAS, MD Consulting Physician Gastroenterology  02/09/19     Chief Complaint  Patient presents with   Annual Exam    Pt is fasting. Chronic condition management Influenza vaccine- pharmacy     Subjective:  [name] [name] is a 73 y.o. male present for CPE and chronic condition management. All past medical history, surgical history, allergies, family history, immunizations, medications and social history were updated in the electronic medical record today. All recent labs, ED visits and hospitalizations within the last year were reviewed.  Health maintenance:  Colonoscopy: last screen 07/2019, recommend follow up 7 years; Completed by Dr. Legrand. Immunizations:  tdap  UTD 2020, influenza -declined, PNA series completed, shingrix series completed Infectious disease screening: HIV declined, Hep C declined PSA:  Lab Results  Component Value Date   PSA 1.07 06/19/2023   PSA 0.67 02/09/2019   PSA 1.10 02/13/2016   , pt was counseled on prostate cancer screenings.  Patient has a Dental home. Hospitalizations/ED visits: reviewed  Hypertension/hyperlipidemia/prior history of MI, aortic atherosclerosis/CAD: Pt reports compliance with lisinopril  5 mg and Crestor .  Pt takes a daily baby ASA. Pt is  prescribed statin. Patient denies chest pain, shortness of breath, dizziness or lower extremity edema.   Diet: Patient follows a low-sodium diet Exercise: Patient exercises routinely RF: Former smoker.  Prior MI.  Hyperlipidemia.  Patient follows with cardiology/Dr. Wonda (MI in 2009)      06/29/2024    8:15 AM 01/01/2024    7:40 AM 11/13/2023   11:52 AM 11/07/2022    9:35 AM 05/23/2022    1:49  PM  Depression screen PHQ 2/9  Decreased Interest 0 0 0 0 0  Down, Depressed, Hopeless 0 0 0 0 0  PHQ - 2 Score 0 0 0 0 0  Altered sleeping   0    Tired, decreased energy   0    Change in appetite   0    Feeling bad or failure about yourself    0    Trouble concentrating   0    Moving slowly or fidgety/restless   0    Suicidal thoughts   0    PHQ-9 Score   0    Difficult doing work/chores   Not difficult at all            06/29/2024    8:15 AM 01/01/2024    7:40 AM 11/13/2023   11:48 AM 11/07/2022    9:37 AM 05/23/2022    1:50 PM  Fall Risk   Falls in the past year? 0 0 0 0 0  Number falls in past yr: 0 0 0 0 0  Injury with Fall? 0 0 0 0 0  Risk for fall due to : No Fall Risks No Fall Risks  No Fall Risks No Fall Risks  Follow up Falls evaluation completed Falls evaluation completed Falls evaluation completed;Education provided;Falls prevention discussed Falls prevention discussed Falls evaluation completed      Data saved with a previous flowsheet row definition    Immunization History  Administered Date(s) Administered    sv, Bivalent, Protein Subunit Rsvpref,pf [place]) 07/28/2022   Fluad Quad(high Dose 65+)  06/01/2019, 07/01/2020, 06/16/2021, 05/23/2022   INFLUENZA, HIGH DOSE SEASONAL PF 05/09/2018, 05/31/2023, 06/22/2024   Influenza,inj,Quad PF,6+ Mos 07/02/2017   Influenza-Unspecified 06/04/2023   Moderna Covid-19 Fall Seasonal Vaccine 35yrs & older 06/19/2022, 11/16/2022, 05/31/2023   PFIZER Comirnaty(Gray Top)Covid-19 Tri-Sucrose Vaccine 03/18/2021   PFIZER(Purple Top)SARS-COV-2 Vaccination 10/08/2019, 11/02/2019, 06/06/2020   PNEUMOCOCCAL CONJUGATE-20 09/30/2023   Pfizer(Comirnaty)Fall Seasonal Vaccine 12 years and older 06/22/2024   Pneumococcal Conjugate-13 07/02/2017   Pneumococcal Polysaccharide-23 05/09/2018   Tdap 02/09/2019   Unspecified SARS-COV-2 Vaccination 06/04/2023   Zoster Recombinant(Shingrix) 05/28/2018, 08/29/2018    Past Medical History:   Diagnosis Date   Chicken pox    Colon polyps    Coronary artery disease    COVID-19 05/2021   Emphysema of lung (HCC)    History of shingles    History of tobacco use    HIV screening declined 02/09/2019   Hyperlipidemia    Hypertension    Myocardial infarction (HCC) 12/08/2007   stent   No Known Allergies Past Surgical History:  Procedure Laterality Date   CATARACT EXTRACTION, BILATERAL  05/2016   COLONOSCOPY  last 10/03/2015   CORONARY STENT PLACEMENT  2009   KNEE ARTHROSCOPY Left 1997   KNEE ARTHROSCOPY Right 2005   REVERSE SHOULDER ARTHROPLASTY Right 01/11/2022   Procedure: REVERSE SHOULDER ARTHROPLASTY;  Surgeon: Melita Drivers, MD;  Location: [facility];  Service: Orthopedics;  Laterality: Right;    VASECTOMY  1980   WISDOM TOOTH EXTRACTION  1980   Family History  Problem Relation Age of Onset   Heart disease Father    Heart disease Paternal Uncle    Heart disease Mother    Stroke Mother    Stroke Sister    Colon cancer Neg Hx    Rectal cancer Neg Hx    Stomach cancer Neg Hx    Esophageal cancer Neg Hx    Social History   Social History Narrative   [name] [name] lives with his wife and her 3 children. He works at herbalist & frequently lifts heavy objects.    Allergies as of 06/29/2024   No Known Allergies      Medication List        Accurate as of June 29, 2024  8:22 AM. If you have any questions, ask your nurse or doctor.          aspirin 81 MG tablet Take 81 mg by mouth daily.   Co Q 10 100 MG Caps Take 100 mg by mouth daily.   lisinopril  5 MG tablet Commonly known as: ZESTRIL  Take 1 tablet (5 mg total) by mouth daily.   rosuvastatin  40 MG tablet Commonly known as: CRESTOR  Take 1 tablet (40 mg total) by mouth daily.       All past medical history, surgical history, allergies, family history, immunizations andmedications were updated in the EMR today and reviewed under the history and medication portions of their EMR.       ROS: 14 pt review of systems performed and negative (unless mentioned in an HPI)  Objective: BP 122/80   Pulse 69   Temp 97.9 F (36.6 C)   Ht 5' 11 (1.803 m)   Wt 170 lb 12.8 oz (77.5 kg)   SpO2 95%   BMI 23.82 kg/m  Physical Exam Vitals and nursing note reviewed. Exam conducted with a chaperone present.  Constitutional:      General: He is not in acute distress.    Appearance: Normal appearance. He is not ill-appearing, toxic-appearing  or diaphoretic.  HENT:     Head: Normocephalic and atraumatic.     Right Ear: Tympanic membrane, ear canal and external ear normal. There is no impacted cerumen.     Left Ear: Tympanic membrane, ear canal and external ear normal. There is no impacted cerumen.     Nose: Nose normal. No congestion or rhinorrhea.     Mouth/Throat:     Mouth: Mucous membranes are moist.     Pharynx: Oropharynx is clear. No oropharyngeal exudate or posterior oropharyngeal erythema.  Eyes:     General: No scleral icterus.       Right eye: No discharge.        Left eye: No discharge.     Extraocular Movements: Extraocular movements intact.     Pupils: Pupils are equal, round, and reactive to light.  Cardiovascular:     Rate and Rhythm: Normal rate and regular rhythm.     Pulses: Normal pulses.     Heart sounds: Normal heart sounds. No murmur heard.    No friction rub. No gallop.  Pulmonary:     Effort: Pulmonary effort is normal. No respiratory distress.     Breath sounds: Normal breath sounds. No stridor. No wheezing, rhonchi or rales.  Chest:     Chest wall: No tenderness.  Abdominal:     General: Abdomen is flat. Bowel sounds are normal. There is no distension.     Palpations: Abdomen is soft. There is no mass.     Tenderness: There is no abdominal tenderness. There is no right CVA tenderness, left CVA tenderness, guarding or rebound.     Hernia: No hernia is present.  Musculoskeletal:        General: No swelling or tenderness. Normal range of  motion.     Cervical back: Normal range of motion and neck supple.     Right lower leg: No edema.     Left lower leg: No edema.  Lymphadenopathy:     Cervical: No cervical adenopathy.  Skin:    General: Skin is warm and dry.     Coloration: Skin is not jaundiced.     Findings: No bruising, lesion or rash.  Neurological:     General: No focal deficit present.     Mental Status: He is alert and oriented to person, place, and time. Mental status is at baseline.     Cranial Nerves: No cranial nerve deficit.     Sensory: No sensory deficit.     Motor: No weakness.     Coordination: Coordination normal.     Gait: Gait normal.     Deep Tendon Reflexes: Reflexes normal.  Psychiatric:        Mood and Affect: Mood normal.        Behavior: Behavior normal.        Thought Content: Thought content normal.        Judgment: Judgment normal.    No results found.  Assessment/plan: [name] [name] is a 73 y.o. male present for CPE and chronic condition management Hypertension/atherosclerosis of native coronary artery of native heart without angina pectoris/History of MI (myocardial infarction)/Hyperlipidemia LDL goal <100/on statin therapy.  Stable Continue lisinopril  5 mg daily Continue Crestor  40 mg daily Continue baby aspirin daily Continue daily exercise Continue low-sodium diet Continue routine follow-ups by cardiology, Dr. Wonda  Pulmonary emphysema, unspecified emphysema type (HCC) - asymptomatic. Prior h/o smoking. Quit when he had his MI. Bikes daily.  History of colonic polyps UTD Influenza  vaccine needed declined Prostate cancer screening - PSA, Medicare Diabetes mellitus screening - Hemoglobin A1c  Routine general medical examination at a health care facility (Primary) Patient was encouraged to exercise greater than 150 minutes a week. Patient was encouraged to choose a diet filled with fresh fruits and vegetables, and lean meats. AVS provided to patient today for  education/recommendation on gender specific health and safety maintenance. Colonoscopy: last screen 07/2019, recommend follow up 7 years; Completed by Dr. Legrand. Immunizations:  tdap  UTD 2020, influenza -UTD PNA series completed, shingrix series completed Infectious disease screening: HIV declined, Hep C declined   Return in about 25 weeks (around 12/21/2024) for Routine chronic condition follow-up.  Orders Placed This Encounter  Procedures   CBC   Comprehensive metabolic panel with GFR   Hemoglobin A1c   Lipid panel   TSH   PSA, Medicare     Note is dictated utilizing voice recognition software. Although note has been proof read prior to signing, occasional typographical errors still can be missed. If any questions arise, please do not hesitate to call for verification.  Electronically signed by: Charlies Bellini, DO [facility] [place]

## 2024-06-29 NOTE — Patient Instructions (Addendum)
 Return in about 25 weeks (around 12/21/2024) for Routine chronic condition follow-up.        Great to see you today.  I have refilled the medication(s) we provide.   If labs were collected or images ordered, we will inform you of  results once we have received them and reviewed. We will contact you either by echart message, or telephone call.  Please give ample time to the testing facility, and our office to run,  receive and review results. Please do not call inquiring of results, even if you can see them in your chart. We will contact you as soon as we are able. If it has been over 1 week since the test was completed, and you have not yet heard from us , then please call us .    - echart message- for normal results that have been seen by the patient already.   - telephone call: abnormal results or if patient has not viewed results in their echart.  If a referral to a specialist was entered for you, please call us  in 2 weeks if you have not heard from the specialist office to schedule.

## 2024-06-30 ENCOUNTER — Ambulatory Visit: Payer: Self-pay | Admitting: Family Medicine

## 2024-09-05 ENCOUNTER — Encounter: Payer: Self-pay | Admitting: Family Medicine

## 2024-09-05 DIAGNOSIS — I252 Old myocardial infarction: Secondary | ICD-10-CM

## 2024-10-16 ENCOUNTER — Ambulatory Visit: Admitting: Cardiovascular Disease

## 2024-11-18 ENCOUNTER — Ambulatory Visit
